# Patient Record
Sex: Male | Born: 1980 | Race: White | Hispanic: No | Marital: Married | Smoking: Never smoker
Health system: Southern US, Community
[De-identification: ages and names within clinical notes are randomized; demographics above are authoritative.]

## PROBLEM LIST (undated history)

## (undated) DIAGNOSIS — Z719 Counseling, unspecified: Secondary | ICD-10-CM

## (undated) DIAGNOSIS — E559 Vitamin D deficiency, unspecified: Secondary | ICD-10-CM

## (undated) DIAGNOSIS — F419 Anxiety disorder, unspecified: Secondary | ICD-10-CM

## (undated) DIAGNOSIS — K30 Functional dyspepsia: Secondary | ICD-10-CM

## (undated) DIAGNOSIS — Z8719 Personal history of other diseases of the digestive system: Secondary | ICD-10-CM

## (undated) DIAGNOSIS — K219 Gastro-esophageal reflux disease without esophagitis: Secondary | ICD-10-CM

## (undated) DIAGNOSIS — K760 Fatty (change of) liver, not elsewhere classified: Secondary | ICD-10-CM

## (undated) DIAGNOSIS — K5792 Diverticulitis of intestine, part unspecified, without perforation or abscess without bleeding: Secondary | ICD-10-CM

## (undated) DIAGNOSIS — E669 Obesity, unspecified: Secondary | ICD-10-CM

## (undated) DIAGNOSIS — L290 Pruritus ani: Secondary | ICD-10-CM

## (undated) HISTORY — DX: Vitamin D deficiency, unspecified: E55.9

## (undated) HISTORY — DX: Counseling, unspecified: Z71.9

## (undated) HISTORY — PX: APPENDECTOMY: SHX54

## (undated) HISTORY — DX: Pruritus ani: L29.0

## (undated) HISTORY — PX: TONSILLECTOMY: SUR1361

## (undated) HISTORY — DX: Diverticulitis of intestine, part unspecified, without perforation or abscess without bleeding: K57.92

## (undated) HISTORY — DX: Functional dyspepsia: K30

## (undated) HISTORY — DX: Gastro-esophageal reflux disease without esophagitis: K21.9

## (undated) HISTORY — DX: Fatty (change of) liver, not elsewhere classified: K76.0

## (undated) HISTORY — DX: Anxiety disorder, unspecified: F41.9

## (undated) HISTORY — DX: Obesity, unspecified: E66.9

## (undated) HISTORY — PX: OTHER SURGICAL HISTORY: SHX169

---

## 2014-06-28 ENCOUNTER — Telehealth: Payer: Self-pay | Admitting: Internal Medicine

## 2014-06-28 NOTE — Telephone Encounter (Signed)
Pts wife called and pt would like to switch care from [facility] to Dr. [name]. Pt has had several other patients and a nurse suggest Dr. [name]. Pt was recently see in hospital in [place] for diverticulitis, pt had CT scan and was treated with cipro and flagyl. Pt is better, [place] told [place] he would need a colon done in 4 weeks. Discussed with pts wife that Dr. [name] would need to review the records and decide. Records placed on Dr. Lamar SprinklesPerry's desk for review.

## 2014-06-28 NOTE — Telephone Encounter (Signed)
Pt scheduled to see Amy Esterwood PA 07/04/14@3pm . Pt aware.

## 2014-06-28 NOTE — Telephone Encounter (Signed)
This patient is transferring care from [facility]. He is requesting to see Dr. [name]. We have records here for the pt. The pt's wife Genia PlantsRobin Bar states the pt was diagnosed with diverticulitis, and told he would need a colon within 4 weeks. They want to know if it is something that can wait, or if he needs to be seen right away.

## 2014-06-28 NOTE — Telephone Encounter (Signed)
I have not seen the records, but it is okay to assume his GI care. Have him see one of the extenders (give them the records). They can decide if/when follow-up colonoscopy to be performed. I can perform that exam for him, if indicated.

## 2014-07-03 ENCOUNTER — Encounter: Payer: Self-pay | Admitting: *Deleted

## 2014-07-04 ENCOUNTER — Encounter: Payer: Self-pay | Admitting: Physician Assistant

## 2014-07-04 ENCOUNTER — Ambulatory Visit (INDEPENDENT_AMBULATORY_CARE_PROVIDER_SITE_OTHER): Payer: Managed Care, Other (non HMO) | Admitting: Physician Assistant

## 2014-07-04 VITALS — BP 128/80 | HR 80 | Ht 67.0 in | Wt 189.0 lb

## 2014-07-04 DIAGNOSIS — K5732 Diverticulitis of large intestine without perforation or abscess without bleeding: Secondary | ICD-10-CM

## 2014-07-04 DIAGNOSIS — K219 Gastro-esophageal reflux disease without esophagitis: Secondary | ICD-10-CM

## 2014-07-04 NOTE — Patient Instructions (Signed)
Call our office as needed for an appointment with Dr. Yancey FlemingsJohn Perry or one of our PA's..  We have given you a brochure on Diverticulosis & Diverticulitis.

## 2014-07-04 NOTE — Progress Notes (Signed)
Patient ID: Mario Ford, male   DOB: 1980-10-05, 34 y.o.   MRN: [id number]   Subjective:    Patient ID: Mario Ford, male    DOB: 1980-10-05, 34 y.o.   MRN: [id number]  HPI [name] is a pleasant 34 year old white male who comes in today to establish with Dr. [name]. He had been previously seen in [place] [facility]. He had a recent episode of diverticulitis and apparently was recommended to have a colonoscopy. He came here to discuss whether or not he needs colonoscopy. Patient had an episode of fairly severe lower abdominal pain on 06/16/2014 and underwent CT scan of the abdomen and pelvis with IV contrast [facility]. This was read as possible mild hepatic steatosis and uncomplicated acute sigmoid diverticulitis there was focal P colonic stranding with colonic wall thickening seen involving the left lower quadrant sigmoid colon and and large diverticulum was noted in that region no evidence for abscess. Patient was treated with a course of Cipro and Flagyl and he says his symptoms have completely resolved and he feels fine. This was his first episode of diverticulitis. He does have history of chronic GERD and is maintained on omeprazole 40 mg by mouth daily. He did have a prior EGD, which showed a small hiatal hernia. His records from [facility] will be scanned. Family history is negative for colon cancer and inflammatory bowel disease. Patient says generally does not have any problems with abdominal pain bowel movements are normal no melena or hematochezia. He has had some intermittent anal itching. He used to course of suppositories at one time which he thought helped a bit he still has intermittent pruritus.  Review of Systems Pertinent positive and negative review of systems were noted in the above HPI section.  All other review of systems was otherwise negative. Outpatient Encounter Prescriptions as of 07/04/2014  Medication Sig  . omeprazole (PRILOSEC) 40 MG  capsule Take 40 mg by mouth daily.  . [DISCONTINUED] ciprofloxacin (CIPRO) 500 MG tablet Take 500 mg by mouth 2 (two) times daily.  . [DISCONTINUED] metroNIDAZOLE (FLAGYL) 500 MG tablet Take 500 mg by mouth 2 (two) times daily.  . [DISCONTINUED] oxyCODONE-acetaminophen (PERCOCET/ROXICET) 5-325 MG per tablet Take 1 tablet by mouth every 6 (six) hours as needed for severe pain (As needed for pain,).   Allergies  Allergen Reactions  . Sulfa Antibiotics    Patient Active Problem List   Diagnosis Date Noted  . GERD (gastroesophageal reflux disease) 07/04/2014   History   Social History  . Marital Status: Married    Spouse Name: N/A  . Number of Children: N/A  . Years of Education: N/A   Occupational History  . Not on file.   Social History Main Topics  . Smoking status: Never Smoker   . Smokeless tobacco: Never Used  . Alcohol Use: 0.0 oz/week    0 Standard drinks or equivalent per week     Comment: rare  . Drug Use: No  . Sexual Activity: Not on file   Other Topics Concern  . Not on file   Social History Narrative    Mr. Marban's family history includes Cancer in his father; Diabetes Mellitus II in his maternal grandfather and paternal grandfather; Hypertension in an other family member. There is no history of Colon cancer.      Objective:    Filed Vitals:   07/04/14 1506  BP: 128/80  Pulse: 80    Physical Exam  well-developed young white male in  no acute distress, blood pressure 128/80 pulse 80 height 5 foot 7 weight 189. HEENT; nontraumatic normocephalic EOMI PERRLA sclera anicteric, Supple; no JVD, Cardiovascular; regular rate and rhythm with S1-S2 no murmur or gallop, Pulmonary; clear bilaterally, Abdomen ;soft nontender nondistended bowel sounds are active there is no palpable mass or hepatosplenomegaly, Rectal ;exam not done, Extremities; no clubbing cyanosis or edema skin warm and dry, Psych; mood and affect appropriate       Assessment & Plan:  #1 34 yo  male with recent acute initial episode of uncomplicated sigmoid diverticulitis. #2 intermittent anal pruritis #3 GERD  Plan; discussion with patient regarding diverticular disease and natural course of diverticular disease. I do not feel he has to have a colonoscopy at this point. If he has recurrent episodes of diverticulitis he will need a colonoscopy, as at his young age he may be best served by partial colectomy. Patient advised to avoid popcorn and nuts and otherwise increase fiber in diet He will call us if he has any recurrence of abdominal pain. Advised moistened wipes for pruritus and Preparation H suppositories for now if itching becomes more chronic he will return for follow-up office visit. He is asked to follow-up in one year or sooner when necessary with Dr. Marina Goodell.   Amy S Esterwood PA-C 07/04/2014   Cc: No ref. provider found

## 2014-07-10 NOTE — Progress Notes (Signed)
Agree 

## 2014-07-30 ENCOUNTER — Telehealth: Payer: Self-pay | Admitting: Physician Assistant

## 2014-07-30 NOTE — Telephone Encounter (Signed)
I would not start antibiotics based upon the symptoms so far. Diverticulitis does not usually cause diarrhea or bleeding though it can. He is going to need to be seen again, given the bleeding and problems, either with an advance practitioner or Dr. [name] to determine the next step. Please make a follow-up appointment for him. She is getting worse or failing to resolve before he comes in he should call us back. Anything more severe would require an emergency room visit.

## 2014-07-30 NOTE — Telephone Encounter (Signed)
Pt scheduled to see Willette ClusterPaula Guenther NP 08/03/14@2pm . Pt aware and knows to call back if worsens or proceed to ER if severe symptoms.

## 2014-07-30 NOTE — Telephone Encounter (Signed)
Mario Ford pt last seen 07/04/14 By Mike GipAmy Esterwood PA for diverticulitis. Pt states that this morning he started having some abdominal cramping and was a little constipated. Once he passed the hard stool he then had diarrhea that was followed by clear mucous. Now he states he is passing some blood mixed with mucous. Pt states he is still having some abdominal cramping off and on. Pt calling to see if he needs to start back on some more antibiotics. Dr. Leone Payorgessner as doc of the day please advise.

## 2014-08-03 ENCOUNTER — Encounter: Payer: Self-pay | Admitting: Nurse Practitioner

## 2014-08-03 ENCOUNTER — Ambulatory Visit (INDEPENDENT_AMBULATORY_CARE_PROVIDER_SITE_OTHER): Payer: Managed Care, Other (non HMO) | Admitting: Nurse Practitioner

## 2014-08-03 VITALS — BP 124/82 | HR 74 | Resp 16 | Ht 67.0 in | Wt 192.4 lb

## 2014-08-03 DIAGNOSIS — K648 Other hemorrhoids: Secondary | ICD-10-CM | POA: Diagnosis not present

## 2014-08-03 MED ORDER — HYDROCORTISONE ACETATE 25 MG RE SUPP: 25.0000 mg | Freq: Every day | RECTAL | Status: DC

## 2014-08-03 NOTE — Patient Instructions (Signed)
We have sent the following medications to your pharmacy for you to pick up at your convenience: Anusol supp at bedtime for 10 days Please call our office if bleeding starts again

## 2014-08-03 NOTE — Progress Notes (Signed)
     History of Present Illness:   Patient is a 34 year old male seen here less than one month ago to discuss need for colonoscopy after an episode of diverticulitis in [place] . He improved with antibiotics. We did not recommend colonoscopy unless patient had recurrent bouts of diverticulitis.   Early Monday morning patient woke up with abdominal cramping. He went to the bathroom and passed a normal stool followed by loose stool with mucus and blood. His next bowel movement a couple of days later was formed but still had a small amount of blood on it. All of his symptoms have since resolved, stools back to normal.   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 Blood is always bright red, minor amount, and located on the tissue alone.    Current Medications, Allergies, Past Medical History, Past Surgical History, Family History and Social History were reviewed in [facility] electronic medical record.  Physical Exam: General: Pleasant, well developed , white male in no acute distress Head: Normocephalic and atraumatic Eyes:  sclerae anicteric, conjunctiva pink  Ears: Normal auditory acuity Lungs: Clear throughout to auscultation Heart: Regular rate and rhythm Abdomen: Soft, non distended, non-tender. No masses, no hepatomegaly. Normal bowel sounds Rectal: Mildly inflamed external hemorrhoids. On anoscopy patient had inflamed internal hemorrhoids Musculoskeletal: Symmetrical with no gross deformities  Extremities: No edema  Neurological: Alert oriented x 4, grossly nonfocal Psychological:  Alert and cooperative. Normal mood and affect  Assessment and Recommendations:  481. 34 year old male with recent crampy diarrheal illness lasting less than 24 hours. Patient had some associated rectal bleeding. All of his symptoms have resolved. Suspect this was viral in nature and bleeding related to hemorrhoids. There  are some bacterial infections which can cause bloody diarrhea but this seems unlikely even short duration of symptoms.   2. Hemorrhoids. Patient has internal and external hemorrhoids. He describes several years of occasional scant bleeding with bowel movements (drop of blood on tissue). Only once has he seen blood on the stool itself or in toilet bowel and that was Monday with the diarrheal illness. No weight loss, no family history colon cancer. Colonoscopy discussed but we both agree that the scant bleeding seems most likely hemorrhoidal in nature. Will treat with steroid suppositories. He will contact us for increased bleeding (amount, frequency) or bowel changes at which time colonoscopy will be warranted.

## 2014-08-06 NOTE — Progress Notes (Signed)
Gunnar FusiPaula, please follow-up with him yourself. Any further issues, whatsoever, I would proceed with colonoscopy. Thank you

## 2014-10-18 ENCOUNTER — Telehealth: Payer: Self-pay | Admitting: Internal Medicine

## 2014-10-18 MED ORDER — CIPROFLOXACIN HCL 500 MG PO TABS: 500.0000 mg | ORAL_TABLET | Freq: Two times a day (BID) | ORAL | Status: DC

## 2014-10-18 NOTE — Telephone Encounter (Signed)
Spoke with pts wife and she is aware. Script sent to pharmacy. 

## 2014-10-18 NOTE — Telephone Encounter (Signed)
Pts wife states her husband is having terrible LLQ abd pain and has a temp of 100.5. Pt feels he is having a diverticulitis flare, he is out of town on vacation but is coming home this pm due to feeling bad. Request appt tomorrow, pt scheduled to see Willette ClusterPaula Guenther NP tomorrow at 3pm. Would like to start on antibiotics today and also requesting something for pain. Dr. [name] please advise.

## 2014-10-18 NOTE — Telephone Encounter (Signed)
Cipro 500 mg twice a day 10 days, Tylenol for pain, full liquid diet, and see Gunnar FusiPaula tomorrow. Thank you

## 2014-10-19 ENCOUNTER — Ambulatory Visit (INDEPENDENT_AMBULATORY_CARE_PROVIDER_SITE_OTHER): Payer: Managed Care, Other (non HMO) | Admitting: Nurse Practitioner

## 2014-10-19 ENCOUNTER — Other Ambulatory Visit (INDEPENDENT_AMBULATORY_CARE_PROVIDER_SITE_OTHER): Payer: Managed Care, Other (non HMO)

## 2014-10-19 ENCOUNTER — Encounter: Payer: Self-pay | Admitting: Nurse Practitioner

## 2014-10-19 VITALS — BP 138/78 | Ht 67.0 in | Wt 195.1 lb

## 2014-10-19 DIAGNOSIS — K5732 Diverticulitis of large intestine without perforation or abscess without bleeding: Secondary | ICD-10-CM

## 2014-10-19 LAB — CBC WITH DIFFERENTIAL/PLATELET
Basophils Absolute: 0 10*3/uL (ref 0.0–0.1)
Basophils Relative: 0.3 % (ref 0.0–3.0)
Eosinophils Absolute: 0 10*3/uL (ref 0.0–0.7)
Eosinophils Relative: 0.4 % (ref 0.0–5.0)
HEMATOCRIT: 44.9 % (ref 39.0–52.0)
Hemoglobin: 15 g/dL (ref 13.0–17.0)
Lymphocytes Relative: 14.3 % (ref 12.0–46.0)
Lymphs Abs: 1.6 10*3/uL (ref 0.7–4.0)
MCHC: 33.4 g/dL (ref 30.0–36.0)
MCV: 87.3 fl (ref 78.0–100.0)
MONOS PCT: 6 % (ref 3.0–12.0)
Monocytes Absolute: 0.7 10*3/uL (ref 0.1–1.0)
NEUTROS ABS: 9 10*3/uL — AB (ref 1.4–7.7)
Neutrophils Relative %: 79 % — ABNORMAL HIGH (ref 43.0–77.0)
PLATELETS: 292 10*3/uL (ref 150.0–400.0)
RBC: 5.14 Mil/uL (ref 4.22–5.81)
RDW: 12.5 % (ref 11.5–15.5)
WBC: 11.4 10*3/uL — ABNORMAL HIGH (ref 4.0–10.5)

## 2014-10-19 MED ORDER — METRONIDAZOLE 500 MG PO TABS
500.0000 mg | ORAL_TABLET | Freq: Three times a day (TID) | ORAL | Status: DC
Start: 2014-10-19 — End: 2014-12-12

## 2014-10-19 MED ORDER — HYDROCODONE-ACETAMINOPHEN 5-325 MG PO TABS: 1.0000 | ORAL_TABLET | Freq: Four times a day (QID) | ORAL | Status: DC | PRN

## 2014-10-19 NOTE — Patient Instructions (Addendum)
You have been scheduled for a colonoscopy. Please follow written instructions given to you at your visit today.  Please pick up your prep supplies at the pharmacy within the next 1-3 days. If you use inhalers (even only as needed), please bring them with you on the day of your procedure. Your physician has requested that you go to www.startemmi.com and enter the access code given to you at your visit today. This web site gives a general overview about your procedure. However, you should still follow specific instructions given to you by our office regarding your preparation for the procedure.  Your physician has requested that you go to the basement for lab work before leaving today.  We have sent medications to your pharmacy for you to pick up at your convenience.  Continue Cipro.  Start a clear liquid diet until symptoms are better. If your symptoms worsen go to the ED.       Normal BMI (Body Mass Index- based on height and weight) is between 19 and 25. Your BMI today is Body mass index is 30.55 kg/(m^2). Marland Kitchen. Please consider follow up  regarding your BMI with your Primary Care Provider.

## 2014-10-19 NOTE — Progress Notes (Signed)
     History of Present Illness:   Patient is a 34 year old male who we evaluated in March of this year after an episode of diverticulitis in [place]. We did not recommend colonoscopy unless the patient had recurrent bouts of diverticulitis. I saw the patient late April of this year after an episode of crampy diarrhea associated with rectal bleeding. By the time I saw him, his symptoms had all resolved. Hemorrhoids found on exam. After a lengthy discussion about colonoscopy we both agreed that the bleeding was scant and probably hemorrhoidal in nature. We prescribed steroid suppositories and advised patient to call back if he had recurrent bleeding or any bowel changes Patient here with wife for evaluation of recurrent abdominal pain reminiscent of when he had diverticulitis.  He had similar pain while visiting [place] a few weeks ago. ED in [place] gave him a course of antibiotics and symptoms resolved. Now back with recurrent LLQ pain and fever. Pain unrelated to meals of defecation. No urinary symptoms.   Current Medications, Allergies, Past Medical History, Past Surgical History, Family History and Social History were reviewed in [facility] electronic medical record.   Physical Exam: General: Pleasant, well developed , white male in no acute distress Head: Normocephalic and atraumatic Eyes:  sclerae anicteric, conjunctiva pink  Ears: Normal auditory acuity Lungs: Clear throughout to auscultation Heart: Regular rate and rhythm Abdomen: Soft, non distended, mild-moderate LLQ tenderness.  No masses, no hepatomegaly. Normal bowel sounds Musculoskeletal: Symmetrical with no gross deformities  Extremities: No edema  Neurological: Alert oriented x 4, grossly nonfocal Psychological:  Alert and cooperative. Normal mood and affect  Assessment and Recommendations:  34 year old male with history of sigmoid diverticulitis in March. He had similar pain two weeks ago in [place]. ED there gave him  antibiotics, pain resolved but now with recurrent LLQ pain and fever after completion of antibiotics. Patient needs a colonoscopy to exclude neoplasm. Following that he will likely need surgical evaluation for recurrent diverticulitis, In the meantime he should complete Cipro which we called in for him a few days ago. Will add 10 days of flagyl. Hydrocodone for pain. Stay on clears until feeling better. He will go to ED over weekend if symptoms worsen.

## 2014-10-21 DIAGNOSIS — K5732 Diverticulitis of large intestine without perforation or abscess without bleeding: Secondary | ICD-10-CM | POA: Insufficient documentation

## 2014-10-21 NOTE — Progress Notes (Signed)
Agree with initial assessment and plans 

## 2014-11-28 ENCOUNTER — Encounter: Payer: Self-pay | Admitting: Internal Medicine

## 2014-12-10 ENCOUNTER — Telehealth: Payer: Self-pay | Admitting: Internal Medicine

## 2014-12-10 MED ORDER — NA SULFATE-K SULFATE-MG SULF 17.5-3.13-1.6 GM/177ML PO SOLN: 1.0000 | Freq: Once | ORAL | Status: DC

## 2014-12-10 NOTE — Telephone Encounter (Signed)
Sent rx for suprep to [facility] per patient's request

## 2014-12-12 ENCOUNTER — Ambulatory Visit (AMBULATORY_SURGERY_CENTER): Payer: Managed Care, Other (non HMO) | Admitting: Internal Medicine

## 2014-12-12 ENCOUNTER — Encounter: Payer: Self-pay | Admitting: Internal Medicine

## 2014-12-12 VITALS — BP 137/85 | HR 90 | Temp 99.8°F | Resp 14 | Ht 67.0 in | Wt 195.0 lb

## 2014-12-12 DIAGNOSIS — R935 Abnormal findings on diagnostic imaging of other abdominal regions, including retroperitoneum: Secondary | ICD-10-CM

## 2014-12-12 DIAGNOSIS — K5732 Diverticulitis of large intestine without perforation or abscess without bleeding: Secondary | ICD-10-CM | POA: Diagnosis present

## 2014-12-12 DIAGNOSIS — K5730 Diverticulosis of large intestine without perforation or abscess without bleeding: Secondary | ICD-10-CM | POA: Diagnosis not present

## 2014-12-12 MED ORDER — SODIUM CHLORIDE 0.9 % IV SOLN: 500.0000 mL | INTRAVENOUS | Status: DC

## 2014-12-12 NOTE — Progress Notes (Signed)
Transferred to recovery room. A/O x3, pleased with MAC.  VSS.  Report to Shelia, RN. 

## 2014-12-12 NOTE — Patient Instructions (Addendum)
YOU HAD AN ENDOSCOPIC PROCEDURE TODAY AT THE [facility]:   Refer to the procedure report that was given to you for any specific questions about what was found during the examination.  If the procedure report does not answer your questions, please call your gastroenterologist to clarify.  If you requested that your care partner not be given the details of your procedure findings, then the procedure report has been included in a sealed envelope for you to review at your convenience later.  YOU SHOULD EXPECT: Some feelings of bloating in the abdomen. Passage of more gas than usual.  Walking can help get rid of the air that was put into your GI tract during the procedure and reduce the bloating. If you had a lower endoscopy (such as a colonoscopy or flexible sigmoidoscopy) you may notice spotting of blood in your stool or on the toilet paper. If you underwent a bowel prep for your procedure, you may not have a normal bowel movement for a few days.  Please Note:  You might notice some irritation and congestion in your nose or some drainage.  This is from the oxygen used during your procedure.  There is no need for concern and it should clear up in a day or so.  SYMPTOMS TO REPORT IMMEDIATELY:   Following lower endoscopy (colonoscopy or flexible sigmoidoscopy):  Excessive amounts of blood in the stool  Significant tenderness or worsening of abdominal pains  Swelling of the abdomen that is new, acute  Fever of 100F or higher    For urgent or emergent issues, a gastroenterologist can be reached at any hour by calling [phone].   DIET: Your first meal following the procedure should be a small meal and then it is ok to progress to your normal diet. Heavy or fried foods are harder to digest and may make you feel nauseous or bloated.  Likewise, meals heavy in dairy and vegetables can increase bloating.  Drink plenty of fluids but you should avoid alcoholic beverages for 24  hours.  ACTIVITY:  You should plan to take it easy for the rest of today and you should NOT DRIVE or use heavy machinery until tomorrow (because of the sedation medicines used during the test).    FOLLOW UP: Our staff will call the number listed on your records the next business day following your procedure to check on you and address any questions or concerns that you may have regarding the information given to you following your procedure. If we do not reach you, we will leave a message.  However, if you are feeling well and you are not experiencing any problems, there is no need to return our call.  We will assume that you have returned to your regular daily activities without incident.  If any biopsies were taken you will be contacted by phone or by letter within the next 1-3 weeks.  Please call us at [phone] if you have not heard about the biopsies in 3 weeks.    SIGNATURES/CONFIDENTIALITY: You and/or your care partner have signed paperwork which will be entered into your electronic medical record.  These signatures attest to the fact that that the information above on your After Visit Summary has been reviewed and is understood.  Full responsibility of the confidentiality of this discharge information lies with you and/or your care-partner.   Resume medications. Information given on diverticulosis and high fiber diet. 

## 2014-12-12 NOTE — Op Note (Signed)
[facility] [address] [place], [address]   COLONOSCOPY PROCEDURE REPORT  PATIENT: Mario, Ford  MR#: [id number] BIRTHDATE: 09-27-1980 , 34  yrs. old GENDER: male ENDOSCOPIST: Roxy Cedar, MD REFERRED BY:.  Self / Office PROCEDURE DATE:  12/12/2014 PROCEDURE:   Colonoscopy, diagnostic First Screening Colonoscopy - Avg.  risk and is 50 yrs.  old or older - No.  Prior Negative Screening - Now for repeat screening. N/A  History of Adenoma - Now for follow-up colonoscopy & has been > or = to 3 yrs.  N/A  Polyps removed today? No Recommend repeat exam, <10 yrs? No ASA CLASS:   Class I INDICATIONS:abdominal pain and an abnormal CT.  Question recurrent diverticulitis MEDICATIONS: Monitored anesthesia care and Propofol 300 mg IV  DESCRIPTION OF PROCEDURE:   After the risks benefits and alternatives of the procedure were thoroughly explained, informed consent was obtained.  The digital rectal exam revealed no abnormalities of the rectum.   The LB [id number]  endoscope was introduced through the anus and advanced to the cecum, which was identified by both the appendix and ileocecal valve. No adverse events experienced.   The quality of the prep was excellent. (Suprep was used)  The instrument was then slowly withdrawn as the colon was fully examined. Estimated blood loss is zero unless otherwise noted in this procedure report.  COLON FINDINGS: There was severe diverticulosis noted throughout the entire examined colon. The changes were most prominent in the left colon. There is no active diverticulitis or other mucosal abnormality.   The examination was otherwise normal.  Retroflexed views revealed internal hemorrhoids. The time to cecum = 2.5 Withdrawal time = 5.9   The scope was withdrawn and the procedure completed. COMPLICATIONS: There were no immediate complications.  ENDOSCOPIC IMPRESSION: 1.   Severe diverticulosis was noted throughout the entire  examined colon 2.   The examination was otherwise normal 3. Recurrent diverticulitis. Resolved  RECOMMENDATIONS: 1.  High fiber diet 2.  Continue current colorectal screening recommendations for "routine risk" patients with a repeat colonoscopy age 22. 3. Return to the care of your primary provider. If you have any further difficulties with your abdomen, feel free to contact your primary provider or our office.Marland Kitchen  eSigned:  Roxy Cedar, MD 12/12/2014 3:40 PM   cc: The Patient    ; Renaldo Harrison, M.D.

## 2014-12-13 ENCOUNTER — Telehealth: Payer: Self-pay | Admitting: Internal Medicine

## 2014-12-13 ENCOUNTER — Telehealth: Payer: Self-pay | Admitting: *Deleted

## 2014-12-13 NOTE — Telephone Encounter (Signed)
Pts wife states that the pt was previously taking omeprazole  daily for reflux, his prior GI doc prescribed this. Pt saw his PCP and he was no willing to refill this med as he was concerned about the side effects and possible long term effects of omeprazole. Pt is taking zantac otc but states this is not helping, having a lot of heartburn and discomfort. Wife wants to know if Dr. Marina Goodell thinks pt should resume omeprazole . Please advise.

## 2014-12-13 NOTE — Telephone Encounter (Signed)
Yes, if it works for him. We can prescribe, but he will need an OV in one year if we are are going to manage his GERD

## 2014-12-13 NOTE — Telephone Encounter (Signed)
Left message that we called for f/u 

## 2014-12-14 MED ORDER — OMEPRAZOLE 40 MG PO CPDR: 40.0000 mg | DELAYED_RELEASE_CAPSULE | Freq: Every day | ORAL | Status: DC

## 2014-12-14 NOTE — Telephone Encounter (Signed)
Pts wife aware and script sent to pharmacy. 

## 2015-11-18 ENCOUNTER — Ambulatory Visit (INDEPENDENT_AMBULATORY_CARE_PROVIDER_SITE_OTHER): Payer: Managed Care, Other (non HMO) | Admitting: Physician Assistant

## 2015-11-18 ENCOUNTER — Telehealth: Payer: Self-pay | Admitting: Internal Medicine

## 2015-11-18 ENCOUNTER — Encounter: Payer: Self-pay | Admitting: Physician Assistant

## 2015-11-18 VITALS — BP 124/80 | HR 96 | Ht 65.5 in | Wt 194.2 lb

## 2015-11-18 DIAGNOSIS — K5792 Diverticulitis of intestine, part unspecified, without perforation or abscess without bleeding: Secondary | ICD-10-CM | POA: Diagnosis not present

## 2015-11-18 MED ORDER — CIPROFLOXACIN HCL 500 MG PO TABS: 500.0000 mg | ORAL_TABLET | Freq: Two times a day (BID) | ORAL | 1 refills | Status: AC

## 2015-11-18 NOTE — Patient Instructions (Signed)
We sent a prescription for Cipro 500 mg to [facility] [place] [place].   Please call if symptoms worsen or you are not feeling significantly better.    We made you a follow up appointment with Dr. Yancey FlemingsJohn Perry 12-19-2015 at 2:15 PM.

## 2015-11-18 NOTE — Progress Notes (Signed)
Subjective:    Patient ID: Mario Ford, male    DOB: 07/17/1980, 35 y.o.   MRN: [id number]  HPI Mario Ford is a pleasant 35 year old white male known to Dr. Yancey Flemings with history of GERD and recurrent diverticulitis. Patient comes in today with a 3 day history of chills, fever loose stools and left lower quadrant pain which is very consistent with his episodes of diverticulitis. Patient says he has been having recurrent symptoms at least every 2 months with episodes of what he describes as a deep tenderness in the left side associated with bloating and pressure in his lower abdomen. He says usually he will go on a liquid diet for couple of days and these mild episodes clear up without any other medications. The last episode was about 3 weeks ago and lasted 2 days.  His last episode of acute diverticulitis requiring antibiotics in August 2016.   He has had several episodes of diverticulitis over the past couple of years. Last colonoscopy done in August 2016 showed severe diverticulosis of the entire colon -more prominent in the left colon. He is frustrated and concerned with his recurrent episodes. Does not have any problems with constipation, is generally very regular and eats a high-fiber diet.  Review of Systems Pertinent positive and negative review of systems were noted in the above HPI section.  All other review of systems was otherwise negative.  Outpatient Encounter Prescriptions as of 11/18/2015  Medication Sig  . Multiple Vitamin (MULTIVITAMIN) tablet Take 1 tablet by mouth daily.  Marland Kitchen omeprazole (PRILOSEC) 40 MG capsule Take 1 capsule (40 mg total) by mouth daily.  . ciprofloxacin (CIPRO) 500 MG tablet Take 1 tablet (500 mg total) by mouth 2 (two) times daily.  . [DISCONTINUED] HYDROcodone-acetaminophen (NORCO/VICODIN) 5-325 MG per tablet Take 1 tablet by mouth every 6 (six) hours as needed for moderate pain. (Patient not taking: Reported on 12/12/2014)  . [DISCONTINUED] omeprazole (PRILOSEC)  40 MG capsule Take 40 mg by mouth daily.   No facility-administered encounter medications on file as of 11/18/2015.    Allergies  Allergen Reactions  . Sulfa Antibiotics    Patient Active Problem List   Diagnosis Date Noted  . Diverticulitis of colon 10/21/2014  . Internal hemorrhoids 08/03/2014  . GERD (gastroesophageal reflux disease) 07/04/2014   Social History   Social History  . Marital status: Married    Spouse name: N/A  . Number of children: N/A  . Years of education: N/A   Occupational History  . Accountant     Social History Main Topics  . Smoking status: Never Smoker  . Smokeless tobacco: Never Used  . Alcohol use 0.0 oz/week     Comment: rare  . Drug use: No  . Sexual activity: Not on file   Other Topics Concern  . Not on file   Social History Narrative  . No narrative on file    Mario Ford's family history includes Diabetes Mellitus II in his maternal grandfather and paternal grandfather; Melanoma in his father.      Objective:    Vitals:   11/18/15 1430  BP: 124/80  Pulse: 96    Physical Exam  well-developed older white male in no acute distress, pleasant blood pressure 124/80, pulse 96 weight 194. HEENT; nontraumatic normocephalic EOMI PERRLA sclera anicteric, Cardiovascular; regular rate and rhythm with S1-S2 no murmur or gallop,  Pulmonary; clear bilaterally,, Abdomen ;soft, bowel sounds are present he is quite tender in the left lower quadrant is no palpable  mass no guarding or rebound , Rectal ;exam not done, Extremities; no clubbing cyanosis or edema skin warm and dry, Neuropsych; mood and affect appropriate       Assessment & Plan:   #751  35 year old white male with history of recurrent diverticulitis presenting now with acute left lower quadrant pain fever or chills and loose stools 3 days. Symptoms very consistent with his prior episodes of diverticulitis #2 GERD  Plan; start Cipro 500 mg by mouth twice a day 14 days. Patient says he  cannot take metronidazole at this point secondary to significant nausea. Full liquid to very soft diet over the next 4-5 days and push fluids-he can gradually advance his diet as his pain resolves Patient is asked to call should he develop any worsening of symptoms or if he fails to resolve after 2 weeks of Cipro. I suggested he try Florastor or Culturelle on a daily basis after completing antibiotics to see if this may be beneficial for his recurrent mild episodes ofbloatingand pressure. We began discussion about consideration of elective surgery today and he would like to discuss further with Dr. [name]. We will schedule follow-up office visit in 3-4 weeks.   Amy S Esterwood PA-C 11/18/2015   Cc: Renaldo HarrisonAddis, Daniel, DO

## 2015-11-18 NOTE — Telephone Encounter (Signed)
Pts wife states pt is having a diverticulitis flare, states it is the second one in 30 days. States pt is having a lot of pain and requests he be seen today. Pt scheduled to see Mike GipAmy Esterwood PA today at 2:30pm. Pt aware of appt.

## 2015-11-25 NOTE — Progress Notes (Signed)
Agree with initial assessment and plans as outlined 

## 2016-01-08 ENCOUNTER — Ambulatory Visit (INDEPENDENT_AMBULATORY_CARE_PROVIDER_SITE_OTHER): Payer: Managed Care, Other (non HMO) | Admitting: Internal Medicine

## 2016-01-08 ENCOUNTER — Encounter: Payer: Self-pay | Admitting: Internal Medicine

## 2016-01-08 ENCOUNTER — Encounter (INDEPENDENT_AMBULATORY_CARE_PROVIDER_SITE_OTHER): Payer: Self-pay

## 2016-01-08 VITALS — BP 144/80 | HR 112 | Ht 65.5 in | Wt 199.5 lb

## 2016-01-08 DIAGNOSIS — K5732 Diverticulitis of large intestine without perforation or abscess without bleeding: Secondary | ICD-10-CM

## 2016-01-08 MED ORDER — AMOXICILLIN-POT CLAVULANATE 875-125 MG PO TABS: 1.0000 | ORAL_TABLET | Freq: Two times a day (BID) | ORAL | 0 refills | Status: DC

## 2016-01-08 NOTE — Patient Instructions (Signed)
We have sent the following medications to your pharmacy for you to pick up at your convenience: Augmentin  Take Metamucil 1-2 tablespoons of Metamucil daily  [facility] with an appointment with Dr. Johna SheriffHoxworth

## 2016-01-08 NOTE — Progress Notes (Signed)
HISTORY OF PRESENT ILLNESS:  Mario Ford is a 35 y.o. male with a history of recurrent diverticulitis who presents today regarding the same. He is accompanied by his wife. Patient has had a greater than five-year history of recurrent episodes with acute uncomplicated sigmoid diverticulitis. Substantiated by CT scan July 2013 and March 2016 [facility]([place]). Patient was seen initially in this office approximately 1 year ago for the same problem. On 12/12/2014 he underwent complete colonoscopy. The preparation was excellent. The examination revealed severe diverticulosis throughout the entire colon. More recently seen for acute left lower quadrant pain with fevers. Intolerant to metronidazole and was treated with Augmentin. Symptoms have resolved. Feels fine now. Looking for alternative therapies. Heart and frustrated of multiple episodes. Fearful of more complicated episodes in the future. Particularly, interested in the role of surgical therapy. They have many excellent questions.  REVIEW OF SYSTEMS:  All non-GI ROS negative except for muscle cramps  Past Medical History:  Diagnosis Date  . Anal itch   . Diverticulitis   . Esophageal reflux   . Fatty liver   . Functional dyspepsia   . Health education   . Obesity   . Vitamin D deficiency     Past Surgical History:  Procedure Laterality Date  . acl-left knee    . APPENDECTOMY    . TONSILLECTOMY      Social History Mario Ford  reports that he has never smoked. He has never used smokeless tobacco. He reports that he drinks alcohol. He reports that he does not use drugs.  family history includes Diabetes Mellitus II in his maternal grandfather and paternal grandfather; Melanoma in his father.  Allergies  Allergen Reactions  . Sulfa Antibiotics        PHYSICAL EXAMINATION: Vital signs: BP (!) 144/80 (BP Location: Left Arm, Patient Position: Sitting, Cuff Size: Normal)   Pulse (!) 112   Ht 5' 5.5" (1.664 m)   Wt 199 lb 8 oz (90.5 kg)    BMI 32.69 kg/m  General: Well-developed, well-nourished, no acute distress. Obese Abdomen: Not reexamined Psychiatric: alert and oriented x3. Cooperative   ASSESSMENT:  #1. Recurrent acute diverticulitis. Young age. Multiple episodes. I believe that this patient would benefit from surgical therapy. His attacks or always the same (left lower quadrant with fever) and CT imaging has demonstrated sigmoid disease. Thus, I suspect he would do well with left hemicolectomy. We will solicited a surgical opinion. He and his wife are interested   PLAN:  #1. Long discussion on diverticular disease, diverticular spasm, and diverticulitis. We discussed uncomplicated and complicated features of the disease #2. Fiber supplementation with Metamucil #3. Prescription of Augmentin 875 mg twice daily for 10 days to have on hand should he have an attack and not have immediate access to medical assistance. If he does start antibiotic I have asked to contact the office to report this for documentation purposes and to possibly be seen. He agrees. #4. Surgical opinion with Dr. Glenna FellowsBenjamin Hoxworth

## 2016-01-10 ENCOUNTER — Telehealth: Payer: Self-pay

## 2016-01-10 NOTE — Telephone Encounter (Signed)
Faxed information requested by [facility] so they can schedule appointment with Dr. Hoxworth for patient 

## 2016-01-13 ENCOUNTER — Telehealth: Payer: Self-pay | Admitting: Internal Medicine

## 2016-01-13 NOTE — Telephone Encounter (Signed)
Dr. Maisie Fushomas or Dr. Ezzard StandingNewman

## 2016-01-13 NOTE — Telephone Encounter (Signed)
Spoke with pts wife and she is aware. 

## 2016-01-13 NOTE — Telephone Encounter (Signed)
Pt scheduled to see Dr. Johna SheriffHoxworth in November. Pt would like to be seen sooner, has young children and does not want to have surgery around Christmas time. Have to have surgery done before the end of the year though due to insurance.   Pt wants to know what other surgeons he would recommend at [facility] so they can see about getting a sooner appt. Please advise.

## 2016-01-22 ENCOUNTER — Ambulatory Visit: Payer: Self-pay | Admitting: General Surgery

## 2016-02-03 ENCOUNTER — Encounter (HOSPITAL_COMMUNITY): Payer: Self-pay

## 2016-02-05 NOTE — Patient Instructions (Addendum)
Mario Ford  02/05/2016   Your procedure is scheduled on: 02/10/16  [facility]  [facility] to 3rd floor to  Short Stay Center at 1045 AM.  Call this number if you have problems the morning of surgery [phone]   Remember: ONLY 1 PERSON MAY GO WITH YOU TO SHORT STAY TO GET  READY MORNING OF YOUR SURGERY.  FOLLOW BOWEL PREP INSTRUCTIONS AS PER SURGEONS OFFICE.Marland Kitchen.INCREASE FLUID INTAKE WITH BOWEL PREP TO PREVENT DEHYDRATION MAY DRINK CLEAR LIQUIDS MORNING OF SURGERY UNTIL 0645 AM--THEN NOTHING BY MOUTH  Take these medicines the morning of surgery with A SIP OF WATER: Omeprazole DO NOT TAKE ANY DIABETIC MEDICATIONS DAY OF YOUR SURGERY                               You may not have any metal on your body including hair pins and              piercings  Do not wear jewelry, make-up, lotions, powders or perfumes, deodorant             Do not wear nail polish.  Do not shave  48 hours prior to surgery.              Men may shave face and neck.   Do not bring valuables to the hospital. [facility] IS NOT             RESPONSIBLE   FOR VALUABLES.  Contacts, dentures or bridgework may not be worn into surgery.  Leave suitcase in the car. After surgery it may be brought to your room.                Please read over the following fact sheets you were given: _____________________________________________________________________             [facility] - Preparing for Surgery Before surgery, you can play an important role.  Because skin is not sterile, your skin needs to be as free of germs as possible.  You can reduce the number of germs on your skin by washing with CHG (chlorahexidine gluconate) soap before surgery.  CHG is an antiseptic cleaner which kills germs and bonds with the skin to continue killing germs even after washing. Please DO NOT use if you have an allergy to CHG or antibacterial soaps.  If your skin becomes reddened/irritated  stop using the CHG and inform your nurse when you arrive at Short Stay. Do not shave (including legs and underarms) for at least 48 hours prior to the first CHG shower.  You may shave your face/neck. Please follow these instructions carefully:  1.  Shower with CHG Soap the night before surgery and the  morning of Surgery.  2.  If you choose to wash your hair, wash your hair first as usual with your  normal  shampoo.  3.  After you shampoo, rinse your hair and body thoroughly to remove the  shampoo.                           4.  Use CHG as you would any other liquid soap.  You can apply chg directly  to the skin and wash  Gently with a scrungie or clean washcloth.  5.  Apply the CHG Soap to your body ONLY FROM THE NECK DOWN.   Do not use on face/ open                           Wound or open sores. Avoid contact with eyes, ears mouth and genitals (private parts).                       Wash face,  Genitals (private parts) with your normal soap.             6.  Wash thoroughly, paying special attention to the area where your surgery  will be performed.  7.  Thoroughly rinse your body with warm water from the neck down.  8.  DO NOT shower/wash with your normal soap after using and rinsing off  the CHG Soap.                9.  Pat yourself dry with a clean towel.            10.  Wear clean pajamas.            11.  Place clean sheets on your bed the night of your first shower and do not  sleep with pets. Day of Surgery : Do not apply any lotions/deodorants the morning of surgery.  Please wear clean clothes to the hospital/surgery center.  FAILURE TO FOLLOW THESE INSTRUCTIONS MAY RESULT IN THE CANCELLATION OF YOUR SURGERY PATIENT SIGNATURE_________________________________  NURSE SIGNATURE__________________________________  ________________________________________________________________________    CLEAR LIQUID DIET   Foods Allowed                                                                      Foods Excluded  Coffee and tea, regular and decaf                             liquids that you cannot  Plain Jell-O in any flavor                                             see through such as: Fruit ices (not with fruit pulp)                                     milk, soups, orange juice  Iced Popsicles                                    All solid food Carbonated beverages, regular and diet                                    Cranberry, grape and apple juices Sports drinks like Gatorade Lightly seasoned clear broth or consume(fat free) Sugar, honey syrup  Sample Menu Breakfast                                Lunch                                     Supper Cranberry juice                    Beef broth                            Chicken broth Jell-O                                     Grape juice                           Apple juice Coffee or tea                        Jell-O                                      Popsicle                                                Coffee or tea                        Coffee or tea  _____________________________________________________________________

## 2016-02-06 ENCOUNTER — Encounter (HOSPITAL_COMMUNITY): Payer: Self-pay

## 2016-02-06 ENCOUNTER — Encounter (HOSPITAL_COMMUNITY)
Admission: RE | Admit: 2016-02-06 | Discharge: 2016-02-06 | Disposition: A | Discharge status: Home / Self Care | Payer: Managed Care, Other (non HMO) | Source: Ambulatory Visit | Attending: General Surgery | Admitting: General Surgery

## 2016-02-06 DIAGNOSIS — Z01812 Encounter for preprocedural laboratory examination: Secondary | ICD-10-CM | POA: Diagnosis present

## 2016-02-06 DIAGNOSIS — K5792 Diverticulitis of intestine, part unspecified, without perforation or abscess without bleeding: Secondary | ICD-10-CM | POA: Insufficient documentation

## 2016-02-06 HISTORY — DX: Personal history of other diseases of the digestive system: Z87.19

## 2016-02-06 LAB — CBC
HCT: 45.4 % (ref 39.0–52.0)
Hemoglobin: 15.1 g/dL (ref 13.0–17.0)
MCH: 28.8 pg (ref 26.0–34.0)
MCHC: 33.3 g/dL (ref 30.0–36.0)
MCV: 86.6 fL (ref 78.0–100.0)
PLATELETS: 291 10*3/uL (ref 150–400)
RBC: 5.24 MIL/uL (ref 4.22–5.81)
RDW: 13 % (ref 11.5–15.5)
WBC: 12.8 10*3/uL — ABNORMAL HIGH (ref 4.0–10.5)

## 2016-02-06 LAB — ABO/RH: ABO/RH(D): O NEG

## 2016-02-10 ENCOUNTER — Inpatient Hospital Stay (HOSPITAL_COMMUNITY)
Admission: RE | Admit: 2016-02-10 | Discharge: 2016-02-19 | DRG: 330 | Disposition: A | Discharge status: Home / Self Care | Payer: Managed Care, Other (non HMO) | Source: Ambulatory Visit | Attending: General Surgery | Admitting: General Surgery

## 2016-02-10 ENCOUNTER — Inpatient Hospital Stay (HOSPITAL_COMMUNITY): Payer: Managed Care, Other (non HMO) | Admitting: Anesthesiology

## 2016-02-10 ENCOUNTER — Encounter (HOSPITAL_COMMUNITY): Payer: Self-pay

## 2016-02-10 ENCOUNTER — Encounter (HOSPITAL_COMMUNITY)
Admission: RE | Disposition: A | Discharge status: Home / Self Care | Payer: Self-pay | Source: Ambulatory Visit | Attending: General Surgery

## 2016-02-10 DIAGNOSIS — K219 Gastro-esophageal reflux disease without esophagitis: Secondary | ICD-10-CM | POA: Diagnosis present

## 2016-02-10 DIAGNOSIS — I10 Essential (primary) hypertension: Secondary | ICD-10-CM | POA: Diagnosis present

## 2016-02-10 DIAGNOSIS — K5732 Diverticulitis of large intestine without perforation or abscess without bleeding: Secondary | ICD-10-CM | POA: Diagnosis present

## 2016-02-10 DIAGNOSIS — J9382 Other air leak: Secondary | ICD-10-CM | POA: Diagnosis present

## 2016-02-10 DIAGNOSIS — K660 Peritoneal adhesions (postprocedural) (postinfection): Secondary | ICD-10-CM | POA: Diagnosis present

## 2016-02-10 DIAGNOSIS — Z823 Family history of stroke: Secondary | ICD-10-CM

## 2016-02-10 DIAGNOSIS — N400 Enlarged prostate without lower urinary tract symptoms: Secondary | ICD-10-CM | POA: Diagnosis present

## 2016-02-10 DIAGNOSIS — B359 Dermatophytosis, unspecified: Secondary | ICD-10-CM | POA: Diagnosis present

## 2016-02-10 DIAGNOSIS — Z882 Allergy status to sulfonamides status: Secondary | ICD-10-CM

## 2016-02-10 DIAGNOSIS — R000 Tachycardia, unspecified: Secondary | ICD-10-CM | POA: Diagnosis not present

## 2016-02-10 DIAGNOSIS — R52 Pain, unspecified: Secondary | ICD-10-CM

## 2016-02-10 DIAGNOSIS — L0390 Cellulitis, unspecified: Secondary | ICD-10-CM | POA: Diagnosis not present

## 2016-02-10 DIAGNOSIS — K567 Ileus, unspecified: Secondary | ICD-10-CM | POA: Diagnosis not present

## 2016-02-10 DIAGNOSIS — R1032 Left lower quadrant pain: Secondary | ICD-10-CM | POA: Diagnosis present

## 2016-02-10 HISTORY — PX: LAPAROSCOPIC PARTIAL COLECTOMY: SHX5907

## 2016-02-10 LAB — TYPE AND SCREEN
ABO/RH(D): O NEG
Antibody Screen: NEGATIVE

## 2016-02-10 SURGERY — LAPAROSCOPIC PARTIAL COLECTOMY
Anesthesia: General | Site: Abdomen

## 2016-02-10 MED ORDER — FENTANYL CITRATE (PF) 100 MCG/2ML IJ SOLN
INTRAMUSCULAR | Status: DC | PRN
  Administered 2016-02-10 (×8): 50 ug via INTRAVENOUS

## 2016-02-10 MED ORDER — FENTANYL CITRATE (PF) 100 MCG/2ML IJ SOLN
INTRAMUSCULAR | Status: AC
  Filled 2016-02-10: qty 2

## 2016-02-10 MED ORDER — CEFOTETAN DISODIUM-DEXTROSE 2-2.08 GM-% IV SOLR
INTRAVENOUS | Status: AC
  Filled 2016-02-10: qty 50

## 2016-02-10 MED ORDER — SUGAMMADEX SODIUM 500 MG/5ML IV SOLN
INTRAVENOUS | Status: DC | PRN
  Administered 2016-02-10: 200 mg via INTRAVENOUS

## 2016-02-10 MED ORDER — MIDAZOLAM HCL 5 MG/5ML IJ SOLN
INTRAMUSCULAR | Status: DC | PRN
  Administered 2016-02-10 (×4): 1 mg via INTRAVENOUS

## 2016-02-10 MED ORDER — HYDROMORPHONE HCL 1 MG/ML IJ SOLN
INTRAMUSCULAR | Status: AC
  Filled 2016-02-10: qty 1

## 2016-02-10 MED ORDER — HYDROMORPHONE HCL 1 MG/ML IJ SOLN
INTRAMUSCULAR | Status: DC | PRN
  Administered 2016-02-10: 0.5 mg via INTRAVENOUS
  Administered 2016-02-10: 1 mg via INTRAVENOUS
  Administered 2016-02-10: 0.5 mg via INTRAVENOUS

## 2016-02-10 MED ORDER — LIDOCAINE 2% (20 MG/ML) 5 ML SYRINGE
INTRAMUSCULAR | Status: AC
  Filled 2016-02-10: qty 5

## 2016-02-10 MED ORDER — DEXAMETHASONE SODIUM PHOSPHATE 10 MG/ML IJ SOLN
INTRAMUSCULAR | Status: DC | PRN
  Administered 2016-02-10: 10 mg via INTRAVENOUS

## 2016-02-10 MED ORDER — ONDANSETRON HCL 4 MG/2ML IJ SOLN
INTRAMUSCULAR | Status: DC | PRN
  Administered 2016-02-10: 4 mg via INTRAVENOUS

## 2016-02-10 MED ORDER — HYDROMORPHONE 1 MG/ML IV SOLN
INTRAVENOUS | Status: DC
Start: 2016-02-10 — End: 2016-02-11
  Administered 2016-02-10: 20:00:00 via INTRAVENOUS
  Administered 2016-02-11 (×2): 1.8 mg via INTRAVENOUS

## 2016-02-10 MED ORDER — SODIUM CHLORIDE 0.9% FLUSH: 9.0000 mL | INTRAVENOUS | Status: DC | PRN

## 2016-02-10 MED ORDER — SUGAMMADEX SODIUM 200 MG/2ML IV SOLN
INTRAVENOUS | Status: AC
  Filled 2016-02-10: qty 2

## 2016-02-10 MED ORDER — ACETAMINOPHEN 10 MG/ML IV SOLN
INTRAVENOUS | Status: AC
  Filled 2016-02-10: qty 100

## 2016-02-10 MED ORDER — ROCURONIUM BROMIDE 50 MG/5ML IV SOSY
PREFILLED_SYRINGE | INTRAVENOUS | Status: AC
  Filled 2016-02-10: qty 5

## 2016-02-10 MED ORDER — MIDAZOLAM HCL 2 MG/2ML IJ SOLN
INTRAMUSCULAR | Status: AC
  Filled 2016-02-10: qty 2

## 2016-02-10 MED ORDER — 0.9 % SODIUM CHLORIDE (POUR BTL) OPTIME
TOPICAL | Status: DC | PRN
  Administered 2016-02-10: 2000 mL

## 2016-02-10 MED ORDER — LACTATED RINGERS IV SOLN
INTRAVENOUS | Status: DC
  Administered 2016-02-10: 1000 mL via INTRAVENOUS
  Administered 2016-02-10 (×3): via INTRAVENOUS

## 2016-02-10 MED ORDER — PANTOPRAZOLE SODIUM 40 MG PO TBEC
40.0000 mg | DELAYED_RELEASE_TABLET | Freq: Every day | ORAL | Status: DC
  Administered 2016-02-11 – 2016-02-18 (×8): 40 mg via ORAL
  Filled 2016-02-10 (×8): qty 1

## 2016-02-10 MED ORDER — LIDOCAINE HCL (CARDIAC) 20 MG/ML IV SOLN
INTRAVENOUS | Status: DC | PRN
  Administered 2016-02-10: 8 mg via INTRAVENOUS

## 2016-02-10 MED ORDER — ALVIMOPAN 12 MG PO CAPS
12.0000 mg | ORAL_CAPSULE | Freq: Once | ORAL | Status: AC
  Administered 2016-02-10: 12 mg via ORAL
  Filled 2016-02-10: qty 1

## 2016-02-10 MED ORDER — DIPHENHYDRAMINE HCL 12.5 MG/5ML PO ELIX
12.5000 mg | ORAL_SOLUTION | Freq: Four times a day (QID) | ORAL | Status: DC | PRN
  Administered 2016-02-14 – 2016-02-17 (×3): 12.5 mg via ORAL
  Filled 2016-02-10 (×3): qty 5

## 2016-02-10 MED ORDER — ONDANSETRON HCL 4 MG PO TABS: 4.0000 mg | ORAL_TABLET | Freq: Four times a day (QID) | ORAL | Status: DC | PRN

## 2016-02-10 MED ORDER — FENTANYL CITRATE (PF) 100 MCG/2ML IJ SOLN
INTRAMUSCULAR | Status: AC
  Filled 2016-02-10: qty 4

## 2016-02-10 MED ORDER — NALOXONE HCL 0.4 MG/ML IJ SOLN: 0.4000 mg | INTRAMUSCULAR | Status: DC | PRN

## 2016-02-10 MED ORDER — PROPOFOL 10 MG/ML IV BOLUS
INTRAVENOUS | Status: DC | PRN
  Administered 2016-02-10: 180 mg via INTRAVENOUS
  Administered 2016-02-10: 20 mg via INTRAVENOUS

## 2016-02-10 MED ORDER — HYDROMORPHONE HCL 1 MG/ML IJ SOLN
0.2500 mg | INTRAMUSCULAR | Status: DC | PRN
Start: 2016-02-10 — End: 2016-02-10
  Administered 2016-02-10 (×3): 0.5 mg via INTRAVENOUS

## 2016-02-10 MED ORDER — SODIUM CHLORIDE 0.9 % IJ SOLN
INTRAMUSCULAR | Status: AC
  Filled 2016-02-10: qty 50

## 2016-02-10 MED ORDER — SUCCINYLCHOLINE CHLORIDE 20 MG/ML IJ SOLN
INTRAMUSCULAR | Status: AC
  Filled 2016-02-10: qty 1

## 2016-02-10 MED ORDER — ENOXAPARIN SODIUM 40 MG/0.4ML ~~LOC~~ SOLN
40.0000 mg | SUBCUTANEOUS | Status: DC
  Administered 2016-02-11 – 2016-02-19 (×9): 40 mg via SUBCUTANEOUS
  Filled 2016-02-10 (×9): qty 0.4

## 2016-02-10 MED ORDER — ACETAMINOPHEN 10 MG/ML IV SOLN
INTRAVENOUS | Status: DC | PRN
  Administered 2016-02-10 (×2): 1000 mg via INTRAVENOUS

## 2016-02-10 MED ORDER — CHLORHEXIDINE GLUCONATE CLOTH 2 % EX PADS
6.0000 | MEDICATED_PAD | Freq: Once | CUTANEOUS | Status: DC
Start: 2016-02-10 — End: 2016-02-10

## 2016-02-10 MED ORDER — HYDROMORPHONE HCL 2 MG/ML IJ SOLN
INTRAMUSCULAR | Status: AC
  Filled 2016-02-10: qty 1

## 2016-02-10 MED ORDER — OXYCODONE-ACETAMINOPHEN 5-325 MG PO TABS
1.0000 | ORAL_TABLET | ORAL | Status: DC | PRN
  Administered 2016-02-12 (×4): 1 via ORAL
  Administered 2016-02-13 – 2016-02-17 (×27): 2 via ORAL
  Administered 2016-02-18: 1 via ORAL
  Administered 2016-02-18 – 2016-02-19 (×7): 2 via ORAL
  Filled 2016-02-10 (×10): qty 2
  Filled 2016-02-10: qty 1
  Filled 2016-02-10 (×8): qty 2
  Filled 2016-02-10: qty 1
  Filled 2016-02-10 (×2): qty 2
  Filled 2016-02-10 (×2): qty 1
  Filled 2016-02-10 (×6): qty 2
  Filled 2016-02-10: qty 1
  Filled 2016-02-10 (×6): qty 2
  Filled 2016-02-10: qty 1
  Filled 2016-02-10 (×3): qty 2

## 2016-02-10 MED ORDER — DIPHENHYDRAMINE HCL 50 MG/ML IJ SOLN
12.5000 mg | Freq: Four times a day (QID) | INTRAMUSCULAR | Status: DC | PRN
  Administered 2016-02-12 – 2016-02-17 (×9): 12.5 mg via INTRAVENOUS
  Filled 2016-02-10 (×12): qty 1

## 2016-02-10 MED ORDER — BUPIVACAINE LIPOSOME 1.3 % IJ SUSP
20.0000 mL | Freq: Once | INTRAMUSCULAR | Status: AC
  Administered 2016-02-10: 20 mL
  Filled 2016-02-10: qty 20

## 2016-02-10 MED ORDER — DEXTROSE 5 % IV SOLN
2.0000 g | INTRAVENOUS | Status: AC
  Administered 2016-02-10 (×2): 2 g via INTRAVENOUS
  Filled 2016-02-10: qty 2

## 2016-02-10 MED ORDER — KCL IN DEXTROSE-NACL 20-5-0.9 MEQ/L-%-% IV SOLN
INTRAVENOUS | Status: DC
  Administered 2016-02-10 – 2016-02-11 (×2): via INTRAVENOUS
  Filled 2016-02-10 (×2): qty 1000

## 2016-02-10 MED ORDER — CHLORHEXIDINE GLUCONATE CLOTH 2 % EX PADS: 6.0000 | MEDICATED_PAD | Freq: Once | CUTANEOUS | Status: DC

## 2016-02-10 MED ORDER — PROPOFOL 10 MG/ML IV BOLUS
INTRAVENOUS | Status: AC
  Filled 2016-02-10: qty 20

## 2016-02-10 MED ORDER — LACTATED RINGERS IR SOLN
Status: DC | PRN
  Administered 2016-02-10: 1000 mL

## 2016-02-10 MED ORDER — SODIUM CHLORIDE 0.9 % IJ SOLN
INTRAMUSCULAR | Status: DC | PRN
  Administered 2016-02-10: 40 mL

## 2016-02-10 MED ORDER — BUPIVACAINE HCL 0.5 % IJ SOLN
INTRAMUSCULAR | Status: DC | PRN
  Administered 2016-02-10: 15 mL

## 2016-02-10 MED ORDER — BUPIVACAINE HCL (PF) 0.5 % IJ SOLN
INTRAMUSCULAR | Status: AC
Start: 2016-02-10 — End: 2016-02-10
  Filled 2016-02-10: qty 30

## 2016-02-10 MED ORDER — ONDANSETRON HCL 4 MG/2ML IJ SOLN: 4.0000 mg | Freq: Four times a day (QID) | INTRAMUSCULAR | Status: DC | PRN

## 2016-02-10 MED ORDER — ROCURONIUM BROMIDE 100 MG/10ML IV SOLN
INTRAVENOUS | Status: DC | PRN
  Administered 2016-02-10 (×7): 10 mg via INTRAVENOUS
  Administered 2016-02-10: 20 mg via INTRAVENOUS
  Administered 2016-02-10 (×2): 10 mg via INTRAVENOUS
  Administered 2016-02-10: 20 mg via INTRAVENOUS
  Administered 2016-02-10: 10 mg via INTRAVENOUS
  Administered 2016-02-10: 50 mg via INTRAVENOUS

## 2016-02-10 MED ORDER — ONDANSETRON HCL 4 MG/2ML IJ SOLN
4.0000 mg | Freq: Four times a day (QID) | INTRAMUSCULAR | Status: DC | PRN
  Administered 2016-02-10 – 2016-02-15 (×11): 4 mg via INTRAVENOUS
  Filled 2016-02-10 (×11): qty 2

## 2016-02-10 MED ORDER — HYDROMORPHONE 1 MG/ML IV SOLN
INTRAVENOUS | Status: AC
  Filled 2016-02-10: qty 25

## 2016-02-10 SURGICAL SUPPLY — 81 items
APPLIER CLIP ROT 10 11.4 M/L (STAPLE)
BLADE EXTENDED COATED 6.5IN (ELECTRODE) IMPLANT
CABLE HIGH FREQUENCY MONO STRZ (ELECTRODE) IMPLANT
CELLS DAT CNTRL 66122 CELL SVR (MISCELLANEOUS) IMPLANT
CHLORAPREP W/TINT 26ML (MISCELLANEOUS) ×2 IMPLANT
CLIP APPLIE ROT 10 11.4 M/L (STAPLE) IMPLANT
COVER SURGICAL LIGHT HANDLE (MISCELLANEOUS) ×2 IMPLANT
CUTTER FLEX LINEAR 45M (STAPLE) ×2 IMPLANT
DECANTER SPIKE VIAL GLASS SM (MISCELLANEOUS) ×2 IMPLANT
DERMABOND ADVANCED (GAUZE/BANDAGES/DRESSINGS)
DERMABOND ADVANCED .7 DNX12 (GAUZE/BANDAGES/DRESSINGS) IMPLANT
DRAIN CHANNEL 19F RND (DRAIN) IMPLANT
DRAPE LAPAROSCOPIC ABDOMINAL (DRAPES) ×2 IMPLANT
DRAPE UTILITY XL STRL (DRAPES) ×2 IMPLANT
DRSG OPSITE POSTOP 4X10 (GAUZE/BANDAGES/DRESSINGS) IMPLANT
DRSG OPSITE POSTOP 4X12 (GAUZE/BANDAGES/DRESSINGS) ×2 IMPLANT
DRSG OPSITE POSTOP 4X6 (GAUZE/BANDAGES/DRESSINGS) IMPLANT
DRSG OPSITE POSTOP 4X8 (GAUZE/BANDAGES/DRESSINGS) ×2 IMPLANT
DRSG TEGADERM 2-3/8X2-3/4 SM (GAUZE/BANDAGES/DRESSINGS) ×4 IMPLANT
ELECT BLADE TIP CTD 4 INCH (ELECTRODE) ×2 IMPLANT
ELECT PENCIL ROCKER SW 15FT (MISCELLANEOUS) ×4 IMPLANT
ELECT REM PT RETURN 9FT ADLT (ELECTROSURGICAL) ×2
ELECTRODE REM PT RTRN 9FT ADLT (ELECTROSURGICAL) ×1 IMPLANT
GAUZE SPONGE 4X4 12PLY STRL (GAUZE/BANDAGES/DRESSINGS) IMPLANT
GLOVE BIOGEL PI IND STRL 7.5 (GLOVE) ×2 IMPLANT
GLOVE BIOGEL PI INDICATOR 7.5 (GLOVE) ×2
GLOVE ECLIPSE 7.5 STRL STRAW (GLOVE) ×4 IMPLANT
GOWN STRL REUS W/TWL XL LVL3 (GOWN DISPOSABLE) ×8 IMPLANT
GRASPER ENDOPATH ANVIL 10MM (MISCELLANEOUS) ×2 IMPLANT
HANDLE SUCTION POOLE (INSTRUMENTS) ×1 IMPLANT
IRRIG SUCT STRYKERFLOW 2 WTIP (MISCELLANEOUS) ×2
IRRIGATION SUCT STRKRFLW 2 WTP (MISCELLANEOUS) ×1 IMPLANT
LEGGING LITHOTOMY PAIR STRL (DRAPES) ×2 IMPLANT
PACK COLON (CUSTOM PROCEDURE TRAY) ×2 IMPLANT
PACK GENERAL/GYN (CUSTOM PROCEDURE TRAY) ×2 IMPLANT
PAD POSITIONING PINK XL (MISCELLANEOUS) IMPLANT
PORT LAP GEL ALEXIS MED 5-9CM (MISCELLANEOUS) ×2 IMPLANT
POSITIONER SURGICAL ARM (MISCELLANEOUS) IMPLANT
RELOAD 45 VASCULAR/THIN (ENDOMECHANICALS) ×2 IMPLANT
RELOAD BLUE CHELON 45 (STAPLE) IMPLANT
RELOAD PROXIMATE 75MM BLUE (ENDOMECHANICALS) ×2 IMPLANT
RELOAD STAPLER BLUE 60MM (STAPLE) ×1 IMPLANT
RETRACTOR WND ALEXIS 25 LRG (MISCELLANEOUS) ×1 IMPLANT
RTRCTR WOUND ALEXIS 18CM MED (MISCELLANEOUS)
RTRCTR WOUND ALEXIS 25CM LRG (MISCELLANEOUS) ×2
SCISSORS LAP 5X35 DISP (ENDOMECHANICALS) ×2 IMPLANT
SEALER TISSUE X1 CVD JAW (INSTRUMENTS) ×2 IMPLANT
SHEARS HARMONIC ACE PLUS 36CM (ENDOMECHANICALS) IMPLANT
SLEEVE ADV FIXATION 5X100MM (TROCAR) ×6 IMPLANT
SOLUTION ANTI FOG 6CC (MISCELLANEOUS) ×2 IMPLANT
SPONGE LAP 18X18 X RAY DECT (DISPOSABLE) ×4 IMPLANT
STAPLE ECHEON FLEX 60 POW ENDO (STAPLE) ×2 IMPLANT
STAPLER CIRC CVD 29MM 37CM (STAPLE) ×2 IMPLANT
STAPLER CIRC ILS CVD 25MM (STAPLE) ×2 IMPLANT
STAPLER CUT CVD 40MM BLUE (STAPLE) ×2 IMPLANT
STAPLER PROXIMATE 75MM BLUE (STAPLE) ×2 IMPLANT
STAPLER RELOAD BLUE 60MM (STAPLE) ×2
STAPLER VISISTAT 35W (STAPLE) ×2 IMPLANT
SUCTION POOLE HANDLE (INSTRUMENTS) ×2
SUT PDS AB 0 CT1 36 (SUTURE) IMPLANT
SUT PDS AB 1 CTX 36 (SUTURE) IMPLANT
SUT PDS AB 1 TP1 96 (SUTURE) IMPLANT
SUT PROLENE 2 0 KS (SUTURE) ×2 IMPLANT
SUT PROLENE 3 0 SH 48 (SUTURE) IMPLANT
SUT SILK 2 0 (SUTURE) ×1
SUT SILK 2 0 SH CR/8 (SUTURE) ×2 IMPLANT
SUT SILK 2-0 18XBRD TIE 12 (SUTURE) ×1 IMPLANT
SUT SILK 3 0 (SUTURE) ×1
SUT SILK 3 0 SH CR/8 (SUTURE) ×2 IMPLANT
SUT SILK 3-0 18XBRD TIE 12 (SUTURE) ×1 IMPLANT
SYS LAPSCP GELPORT 120MM (MISCELLANEOUS) ×2
SYSTEM LAPSCP GELPORT 120MM (MISCELLANEOUS) ×1 IMPLANT
TAPE CLOTH 4X10 WHT NS (GAUZE/BANDAGES/DRESSINGS) IMPLANT
TOWEL OR 17X26 10 PK STRL BLUE (TOWEL DISPOSABLE) ×4 IMPLANT
TOWEL OR NON WOVEN STRL DISP B (DISPOSABLE) ×4 IMPLANT
TRAY FOLEY W/METER SILVER 16FR (SET/KITS/TRAYS/PACK) ×2 IMPLANT
TROCAR ADV FIXATION 12X100MM (TROCAR) IMPLANT
TROCAR ADV FIXATION 5X100MM (TROCAR) ×2 IMPLANT
TROCAR BLADELESS OPT 5 100 (ENDOMECHANICALS) ×2 IMPLANT
TROCAR XCEL BLUNT TIP 100MML (ENDOMECHANICALS) ×2 IMPLANT
TUBING INSUF HEATED (TUBING) ×2 IMPLANT

## 2016-02-10 NOTE — Transfer of Care (Signed)
Immediate Anesthesia Transfer of Care Note  Patient: Mario CharonAdam Ford  Procedure(s) Performed: Procedure(s): LAPAROSCOPIC LEFT HEMI COLECTOMY CONVERTED TO OPEN SUBTOTAL COLECTOMY (N/A)  Patient Location: PACU  Anesthesia Type:General  Level of Consciousness: awake, alert  and oriented  Airway & Oxygen Therapy: Patient Spontanous Breathing and Patient connected to face mask oxygen  Post-op Assessment: Report given to RN and Post -op Vital signs reviewed and stable  Post vital signs: Reviewed and stable  Last Vitals:  Vitals:   02/10/16 1050 02/10/16 1120  BP: (!) 146/107 (!) 164/95  Pulse: (!) 109 (!) 107  Resp: 18   Temp: 36.9 C     Last Pain:  Vitals:   02/10/16 1212  PainSc: 6       Patients Stated Pain Goal: 4 (02/10/16 1212)  Complications: No apparent anesthesia complications

## 2016-02-10 NOTE — H&P (Signed)
History of Present Illness Mario Ford(Mario Ford T. Mario Mollett MD; 01/22/2016 3:03 PM) The patient is a 35 year old male who presents with diverticulitis. He is referred by Dr. Marina Ford for recurring diverticulitis. The patient states he has had some chronic GI symptoms for a number of years, possibly related to irritable bowel, consisting of intermittent diarrhea or cramping or nausea. At one point in 2013 he had a CT scan to evaluate the symptoms and had noted extensive diverticulosis. However in March 2016 he developed acute sigmoid diverticulitis that was confirmed on CT scan. This was treated with antibiotics as an outpatient. He had several flareups of this over the next few months in 2016. It then got a little bit better for a while but he had another severe episode probably early 2017 also treated as an outpatient with antibiotics. Since then he has had intermittent left lower quadrant abdominal pain. Overall he has been treated 5 or 6 times for presumed acute diverticulitis and was recently on Cipro prescribed by Dr. Marina Ford. He watches closely what he eats, eating small amounts which she feels can head off attacks. His episodes have been typical with constant left lower quadrant abdominal pain associated with fever and some change in bowel habits. He had a complete colonoscopy in August 2016 which revealed extensive diverticulosis throughout the entire colon but more concentrated on the left. He is frustrated with recurrent episodes and concerned about future complications of diverticulitis at his age.  I reviewed his previous 2 [place] 2013 showing scattered diverticulosis and then again in March 2016 showing focal pericolonic stranding and colonic wall thickening involving the sigmoid colon with an enlarged apparently inflamed diverticulum seen in this area.   Other Problems Mario Ford(Mario Ford, CMA; 01/22/2016 2:43 PM) Anxiety Disorder Back Pain Diverticulosis Enlarged  Prostate Gastroesophageal Reflux Disease Hemorrhoids  Past Surgical History Mario Ford(Mario Ford, CMA; 01/22/2016 2:43 PM) Appendectomy Knee Surgery Left. Tonsillectomy  Diagnostic Studies History Mario Ford(Mario Ford, CMA; 01/22/2016 2:43 PM) Colonoscopy within last year  Allergies Mario Ford(Mario Ford, CMA; 01/22/2016 2:44 PM) Sulfa Antibiotics  Medication History (Mario Ford, CMA; 01/22/2016 2:45 PM) Omeprazole (10MG  Capsule DR, Oral) Active. Multivitamin Adults (Oral) Active. Medications Reconciled  Social History Mario Ford(Mario Ford, CMA; 01/22/2016 2:43 PM) Alcohol use Occasional alcohol use. Caffeine use Carbonated beverages, Coffee, Tea. No drug use Tobacco use Never smoker.  Family History Mario Ford(Mario Ford, CMA; 01/22/2016 2:43 PM) Cerebrovascular Accident Mother. Melanoma Father. Respiratory Condition Mother.    Review of Systems Mario Ford(Mario Ford CMA; 01/22/2016 2:43 PM) General Not Present- Appetite Loss, Chills, Fatigue, Fever, Night Sweats, Weight Gain and Weight Loss. Skin Not Present- Change in Wart/Mole, Dryness, Hives, Jaundice, New Lesions, Non-Healing Wounds, Rash and Ulcer. HEENT Not Present- Earache, Hearing Loss, Hoarseness, Nose Bleed, Oral Ulcers, Ringing in the Ears, Seasonal Allergies, Sinus Pain, Sore Throat, Visual Disturbances, Wears glasses/contact lenses and Yellow Eyes. Respiratory Not Present- Bloody sputum, Chronic Cough, Difficulty Breathing, Snoring and Wheezing. Breast Not Present- Breast Mass, Breast Pain, Nipple Discharge and Skin Changes. Cardiovascular Not Present- Chest Pain, Difficulty Breathing Lying Down, Leg Cramps, Palpitations, Rapid Heart Rate, Shortness of Breath and Swelling of Extremities. Gastrointestinal Not Present- Abdominal Pain, Bloating, Bloody Stool, Change in Bowel Habits, Chronic diarrhea, Constipation, Difficulty Swallowing, Excessive gas, Gets full quickly at meals, Hemorrhoids, Indigestion, Nausea, Rectal Pain and Vomiting. Male  Genitourinary Not Present- Blood in Urine, Change in Urinary Stream, Frequency, Impotence, Nocturia, Painful Urination, Urgency and Urine Leakage. Musculoskeletal Not Present- Back Pain, Joint Pain, Joint Stiffness, Muscle  Pain, Muscle Weakness and Swelling of Extremities. Neurological Not Present- Decreased Memory, Fainting, Headaches, Numbness, Seizures, Tingling, Tremor, Trouble walking and Weakness. Psychiatric Not Present- Anxiety, Bipolar, Change in Sleep Pattern, Depression, Fearful and Frequent crying. Endocrine Not Present- Cold Intolerance, Excessive Hunger, Hair Changes, Heat Intolerance, Hot flashes and New Diabetes. Hematology Not Present- Blood Thinners, Easy Bruising, Excessive bleeding, Gland problems, HIV and Persistent Infections.  Vitals (Mario Ford CMA; 01/22/2016 2:44 PM) 01/22/2016 2:43 PM Weight: 204 lb Height: 67in Body Surface Area: 2.04 m Body Mass Index: 31.95 kg/m  Temp.: 97.77F(Temporal)  Pulse: 81 (Regular)  BP: 126/76 (Sitting, Left Arm, Standard)       Physical Exam Mario Ford(Mario Campau T. Mario Seder MD; 01/22/2016 3:04 PM) The physical exam findings are as follows: Note:General: Alert, well-developed and well nourished Caucasian male, in no distress Skin: Warm and dry without rash or infection. HEENT: No palpable masses or thyromegaly. Sclera nonicteric. Pupils equal round and reactive. Lymph nodes: No cervical, supraclavicular, or inguinal nodes palpable. Lungs: Breath sounds clear and equal. No wheezing or increased work of breathing. Cardiovascular: Regular rate and rhythm without murmer. No JVD or edema. Peripheral pulses intact. No carotid bruits. Abdomen: Nondistended. Soft with mild left lower quadrant tenderness. No masses palpable. No organomegaly. No palpable hernias. Extremities: No edema or joint swelling or deformity. No chronic venous stasis changes. Neurologic: Alert and fully oriented. Gait normal. No focal weakness. Psychiatric:  Normal mood and affect. Thought content appropriate with normal judgement and insight    Assessment & Plan Mario Ford(Kenard Morawski T. Jennings Stirling MD; 01/22/2016 3:27 PM) SIGMOID DIVERTICULITIS (K57.32) Impression: 35 year old male with initial attack of documented sigmoid diverticulitis seen on CT scan in March 2016. He subsequently has been treated for 4 or 5 further episodes requiring antibiotics and has not been free of symptoms for more than a month or 2 at a time. He also has what sounds like some chronic irritable bowel symptoms in terms of nausea and bloating and occasional change in bowel habits that may or may not be related to his diverticulitis and might not improve with surgery. I discussed all this in detail with the patient and his wife. We discussed options of continued monitoring versus surgical resection. I think at his age and with the frequency of attacks he is having he is unlikely to do well without surgery. We discussed surgery in detail including its nature and expected results as well as risks of anesthetic complications, bleeding, infection, possible need for open surgery, possible anastomotic leak or need for temporary ostomy. There were given literature regarding the procedure. After discussion they would like to proceed with resection which I think is most reasonable. Of note his sister and nephew are diagnosed with factor V Leiden deficiency although he has no history of any abnormal bleeding or thrombosis. I will ask our hematologist if he should be tested preoperatively. Current Plans Laparoscopic left colectomy under general anesthesia following a mechanical and antibiotic bowel prep at home

## 2016-02-10 NOTE — Anesthesia Procedure Notes (Signed)
Procedure Name: Intubation Date/Time: 02/10/2016 12:38 PM Performed by: Gaynelle AduFITZGERALD, WILLIAM Pre-anesthesia Checklist: Patient identified, Emergency Drugs available, Suction available, Patient being monitored and Timeout performed Patient Re-evaluated:Patient Re-evaluated prior to inductionOxygen Delivery Method: Circle system utilized Preoxygenation: Pre-oxygenation with 100% oxygen Intubation Type: IV induction and Cricoid Pressure applied Laryngoscope Size: Mac and 4 Grade View: Grade II Tube type: Oral Tube size: 7.5 mm Number of attempts: 1 Airway Equipment and Method: Stylet Placement Confirmation: ETT inserted through vocal cords under direct vision,  positive ETCO2 and breath sounds checked- equal and bilateral Secured at: 21 cm Tube secured with: Tape Dental Injury: Teeth and Oropharynx as per pre-operative assessment  Comments: Slightly anterior. Cords visualized with head lift/cricoid pressure.

## 2016-02-10 NOTE — Interval H&P Note (Signed)
History and Physical Interval Note:  02/10/2016 12:05 PM  [name]  has presented today for surgery, with the diagnosis of DIVERTICULITIS  The various methods of treatment have been discussed with the patient and family. After consideration of risks, benefits and other options for treatment, the patient has consented to  Procedure(s): LAPAROSCOPIC PARTIAL COLECTOMY (N/A) as a surgical intervention .  The patient's history has been reviewed, patient examined, no change in status, stable for surgery.  I have reviewed the patient's chart and labs.  Questions were answered to the patient's satisfaction.     Ardra Kuznicki T

## 2016-02-10 NOTE — Progress Notes (Addendum)
Mario Ford is a 35 y.o. male patient admitted from OR drowsy, responds to voice - oriented  X 4 - no acute distress noted.  VSS - Blood pressure 110/68, pulse 82, temperature 98.6 F (37 C), temperature source Oral, resp. rate 16, height 5\' 7"  (1.702 m), weight 90.7 kg (200 lb), SpO2 98 %.    IV in place, occlusive dsg intact without redness.  Orientation to room, and floor completed with information packet given to patient/family.  Patient declined safety video at this time.  Admission INP armband ID verified with patient/family, and in place.   SR up x 2, fall assessment complete, with patient and family able to verbalize understanding of risk associated with falls, and verbalized understanding to call nsg before up out of bed.  Call light within reach, patient able to voice, and demonstrate understanding.  Skin, clean-dry- intact without evidence of bruising, or skin tears.  Surgical incisions assessed. No evidence of skin break down noted on exam.     Will cont to eval and treat per MD orders.  Elissa HeftyPatricia D Ramirez, RN 02/10/2016 09:10pm

## 2016-02-10 NOTE — Op Note (Signed)
Preoperative Diagnosis: DIVERTICULITIS  Postoprative Diagnosis: DIVERTICULITIS  Procedure: Procedure(s): LAPAROSCOPIC LEFT HEMI COLECTOMY CONVERTED TO OPEN SUBTOTAL COLECTOMY   Surgeon: Glenna FellowsHoxworth, Nashla Althoff T   Assistants: Twana Firsthelsea Conner  Anesthesia:  General endotracheal anesthesia  Indications: Patient is a 35 year old male with repeated episodes of sigmoid diverticulitis and ongoing symptoms. He has had colonoscopy and imaging studies that confirmed left and sigmoid diverticulitis with extensive diverticulosis throughout the left colon and scattered diverticula throughout the entire colon. After extensive preoperative workup and discussion detailed elsewhere we have elected to proceed with laparoscopic left hemicolectomy to relieve ongoing symptoms and hopefully prevent future complications from his diverticulitis. The nature of the surgery and indications and risks have been discussed extensively with the patient and his family in detailed elsewhere.    Procedure Detail:  He had undergone a mechanical and antibiotic bowel prep at home. He was taken to the operating room, placed in the supine position on the operating table and general endotracheal anesthesia induced. He received preoperative IV antibiotics. Orogastric tube and Foley catheter were placed. PAS were placed. He was carefully positioned and padded in semi-lithotomy position and the perineum and abdomen widely sterilely prepped and draped. Patient timeout was performed and correct procedure verified. Access was obtained with a 5 mm trocar in the right mid abdomen and pneumoperitoneum established. No evidence of trocar injury. Under direct vision a 5 mm trocar was placed just beneath the umbilicus for the camera port, a 12 mm trocar in the right lower quadrant and 5 mm trochars in the left lower quadrant and left mid abdomen. There was noted to be a very large omentum and a large amount of intra-abdominal fat for his size. The omentum  was mobilized from its lesions in the pelvis and the omentum and small bowel displaced into the upper abdomen with the patient in Trendelenburg. There was evidence of significant diverticulitis with thickening of the sigmoid colon and adhesions of the sigmoid up to the left lateral pelvic sidewall. There were small bowel adhesions to the sigmoid and mesosigmoid were carefully mobilized and displaced. Initially I examined the medial aspect of the sigmoid mesentery but it was extremely thick and friable and I elected to approach the dissection laterally. Some inflammatory adhesions were taken down from the lateral pelvic sidewall and then peritoneal attachments of the distal left and sigmoid colon were divided with Harmonic scalpel and the colon and mesentery dissected medially. The ureter was clearly identified and dissected out over a portion and carefully protected throughout the remainder of the dissection. With the ureter and iliac vessels identified I then could dissect medially and the mesentery and the pre-peritoneal space entered and mesentery dissected medially off and away from the ureter. The mesentery of the sigmoid colon was then further divided with the Harmonic scalpel and the inferior mesenteric pedicle was taken away from its origin with a single firing of the white load stapler. Following this the left colon was extensively mobilized dividing lateral peritoneal attachments and dissection carried up toward the splenic flexure which was mobilized. The mesentery was elevated and dissected off of the transverse colon with the Harmonic scalpel and after dissecting through large amount of fatty tissue around the omentum the lesser sac was entered and the transverse colon extensively mobilized away from the omentum and the lesser omentum divided completely mobilizing the transverse colon working over toward the splenic flexure. Following this the splenic flexure could be completely mobilized off of  Gerota's fascia. A point of division of the rectosigmoid  a few centimeters above the peritoneal reflection where the tinea disappeared was chosen and this was dissected on either side opening the mesentery and I bluntly dissected behind the rectosigmoid which was then divided with a single firing of the 60 mm echelon blue load stapler. The mesentery of the more proximal rectosigmoid was then sequentially divided with the Harmonic scalpel keeping the ureter in view until the dissection connected with the previous sigmoid dissection. The left lower quadrant trocar site was then enlarged to about 6 cm as an extraction incision and wound protector placed. The specimen was brought out through the extraction site and although we had achieved what seemed like complete mobilization the sigmoid came down with a little more tension than I had hoped for. However it seemed adequate and this was cleaned of pericolic fat and mesentery and sized to a 29 mm anvil which was placed with a pursestring suture. This was then reintroduced into the abdomen and Dr. Fredricka Bonine was able to pass the 29 mm stapler transanally to the rectal staple line and the spike was deployed, the anvil attached and the stapler closed with seemingly minimal tension. The scope was fired and removed and we had 2 intact donuts. However with insufflation and clamping there was an air leak and we could see a small opening on the anterior portion of the anastomosis and on more careful inspection I felt that this was likely secondary to undue tension and I therefore did not want to just repair this and leave it. At this point I felt the best approach was to open the abdomen to see if we can obtain any more mobility and if not I felt the only other recourse was to proceed with a subtotal colectomy. A low midline incision skirting the umbilicus was used. The transverse colon was carefully identified and inspected and in fact we had achieved a complete mobilization up to  the middle colic vessels and were not able to obtain any further length even with an open incision. As planned then proceeded with a subtotal colectomy. The terminal ileum and right colon were mobilized dividing lateral peritoneal attachments. The dissection was carried up around the hepatic flexure and hepatic colic ligament divided using the Enseal. The omentum was further dissected up off the more proximal transverse colon which was fully mobilized. The rectosigmoid was dissected a centimeter or 2 distal to the previous anastomosis and divided with the contour stapler. The distal couple of centimeters of rectosigmoid anastomosis were then further dissected with Harmonic scalpel through the mesentery again protecting the ureter. The terminal ileum was divided with the GIA stapler and the mesentery of the right and proximal transverse colon sequentially divided with the Enseal. No colic vessels were additionally clamped and ligated. The specimen was removed. The abdomen was thoroughly irrigated with saline and there was no bleeding or other evidence of problems. A side to end anastomosis was created between the terminal ileum and the rectosigmoid using a 25 mm anvil placed into the divided end of the ileum and then the post brought out through the antimesenteric side of the ileum and opened and closed with the GIA stapler. A small silk pursestring was placed around the post of the anvil. Dr. Fredricka Bonine then advanced the 25 mm EEA stapler without difficulty to the rectosigmoid staple line and the post was advanced through the midpoint of the staple line the anvil attached and the stapler closed excluding extraneous material and fired and removed. Donuts were again intact. With the  ileum clamped under saline irrigation with tense air distention there was no evidence of leak. Following this the abdomen was again thoroughly irrigated and hemostasis assured. No evidence of injury. All drapes were removed and all gloves  gowns and instruments changed. The abdomen was again inspected. No problems seen. The fascia of the left lower quadrant incision was closed in layers with 0 PDS and the midline incision was closed with looped running #1 PDS beginning around the incision and tied centrally. Simultaneous tissue was irrigated. The fascia been infiltrated with Exparel. Skin incisions were closed with staples. Sponge needle and instrument counts were correct.   Findings: Above  Estimated Blood Loss:  400 mL         Drains: None  Blood Given: none          Specimens: Subtotal colectomy        Complications:  * No complications entered in OR log *         Disposition: PACU - hemodynamically stable.         Condition: stable

## 2016-02-10 NOTE — Anesthesia Preprocedure Evaluation (Addendum)
Anesthesia Evaluation  Patient identified by MRN, date of birth, ID band Patient awake    Reviewed: Allergy & Precautions, H&P , NPO status , Patient's Chart, lab work & pertinent test results  Airway Mallampati: II  TM Distance: >3 FB Neck ROM: Full    Dental no notable dental hx. (+) Teeth Intact, Dental Advisory Given   Pulmonary neg pulmonary ROS,    Pulmonary exam normal breath sounds clear to auscultation       Cardiovascular negative cardio ROS   Rhythm:Regular Rate:Normal     Neuro/Psych negative neurological ROS  negative psych ROS   GI/Hepatic Neg liver ROS, hiatal hernia, GERD  Medicated and Controlled,  Endo/Other  negative endocrine ROS  Renal/GU negative Renal ROS  negative genitourinary   Musculoskeletal   Abdominal   Peds  Hematology negative hematology ROS (+)   Anesthesia Other Findings   Reproductive/Obstetrics negative OB ROS                            Anesthesia Physical Anesthesia Plan  ASA: II  Anesthesia Plan: General   Post-op Pain Management:    Induction: Intravenous  Airway Management Planned: Oral ETT  Additional Equipment:   Intra-op Plan:   Post-operative Plan: Extubation in OR  Informed Consent: I have reviewed the patients History and Physical, chart, labs and discussed the procedure including the risks, benefits and alternatives for the proposed anesthesia with the patient or authorized representative who has indicated his/her understanding and acceptance.   Dental advisory given  Plan Discussed with: CRNA  Anesthesia Plan Comments:         Anesthesia Quick Evaluation  

## 2016-02-11 ENCOUNTER — Encounter (HOSPITAL_COMMUNITY): Payer: Self-pay | Admitting: General Surgery

## 2016-02-11 LAB — CBC
HCT: 39.8 % (ref 39.0–52.0)
Hemoglobin: 13.6 g/dL (ref 13.0–17.0)
MCH: 28.8 pg (ref 26.0–34.0)
MCHC: 34.2 g/dL (ref 30.0–36.0)
MCV: 84.1 fL (ref 78.0–100.0)
PLATELETS: 307 10*3/uL (ref 150–400)
RBC: 4.73 MIL/uL (ref 4.22–5.81)
RDW: 13.1 % (ref 11.5–15.5)
WBC: 16.4 10*3/uL — ABNORMAL HIGH (ref 4.0–10.5)

## 2016-02-11 LAB — BASIC METABOLIC PANEL
ANION GAP: 6 (ref 5–15)
BUN: 10 mg/dL (ref 6–20)
CALCIUM: 8.4 mg/dL — AB (ref 8.9–10.3)
CO2: 27 mmol/L (ref 22–32)
CREATININE: 1.03 mg/dL (ref 0.61–1.24)
Chloride: 104 mmol/L (ref 101–111)
Glucose, Bld: 190 mg/dL — ABNORMAL HIGH (ref 65–99)
Potassium: 4.6 mmol/L (ref 3.5–5.1)
SODIUM: 137 mmol/L (ref 135–145)

## 2016-02-11 MED ORDER — LORAZEPAM 2 MG/ML IJ SOLN
INTRAMUSCULAR | Status: AC
  Filled 2016-02-11: qty 1

## 2016-02-11 MED ORDER — HYDROMORPHONE HCL 1 MG/ML IJ SOLN
1.0000 mg | INTRAMUSCULAR | Status: DC | PRN
  Administered 2016-02-11 (×4): 2 mg via INTRAVENOUS
  Administered 2016-02-11: 1 mg via INTRAVENOUS
  Administered 2016-02-11 – 2016-02-12 (×14): 2 mg via INTRAVENOUS
  Administered 2016-02-12: 1 mg via INTRAVENOUS
  Administered 2016-02-12 – 2016-02-13 (×7): 2 mg via INTRAVENOUS
  Administered 2016-02-14 (×2): 1 mg via INTRAVENOUS
  Administered 2016-02-14: 2 mg via INTRAVENOUS
  Administered 2016-02-15: 1 mg via INTRAVENOUS
  Administered 2016-02-15: 2 mg via INTRAVENOUS
  Administered 2016-02-15 – 2016-02-18 (×8): 1 mg via INTRAVENOUS
  Filled 2016-02-11 (×2): qty 2
  Filled 2016-02-11: qty 1
  Filled 2016-02-11: qty 2
  Filled 2016-02-11: qty 1
  Filled 2016-02-11 (×2): qty 2
  Filled 2016-02-11: qty 1
  Filled 2016-02-11 (×2): qty 2
  Filled 2016-02-11 (×3): qty 1
  Filled 2016-02-11 (×5): qty 2
  Filled 2016-02-11 (×3): qty 1
  Filled 2016-02-11 (×2): qty 2
  Filled 2016-02-11 (×2): qty 1
  Filled 2016-02-11 (×7): qty 2
  Filled 2016-02-11: qty 1
  Filled 2016-02-11: qty 2
  Filled 2016-02-11 (×3): qty 1
  Filled 2016-02-11: qty 2
  Filled 2016-02-11: qty 1
  Filled 2016-02-11 (×5): qty 2

## 2016-02-11 MED ORDER — SODIUM CHLORIDE 0.9 % IV BOLUS (SEPSIS)
1000.0000 mL | Freq: Once | INTRAVENOUS | Status: AC
  Administered 2016-02-11: 1000 mL via INTRAVENOUS

## 2016-02-11 MED ORDER — HYDROMORPHONE HCL 1 MG/ML IJ SOLN
0.5000 mg | INTRAMUSCULAR | Status: DC | PRN
  Administered 2016-02-11: 1 mg via INTRAVENOUS
  Administered 2016-02-11: 0.5 mg via INTRAVENOUS
  Administered 2016-02-11 (×2): 1 mg via INTRAVENOUS
  Filled 2016-02-11 (×4): qty 1

## 2016-02-11 MED ORDER — POTASSIUM CHLORIDE IN NACL 20-0.9 MEQ/L-% IV SOLN
INTRAVENOUS | Status: DC
  Administered 2016-02-11 (×2): via INTRAVENOUS
  Administered 2016-02-12: 125 mL/h via INTRAVENOUS
  Administered 2016-02-13 – 2016-02-18 (×9): via INTRAVENOUS
  Administered 2016-02-19: 1000 mL via INTRAVENOUS
  Filled 2016-02-11 (×22): qty 1000

## 2016-02-11 MED ORDER — ALVIMOPAN 12 MG PO CAPS
12.0000 mg | ORAL_CAPSULE | Freq: Two times a day (BID) | ORAL | Status: DC
  Administered 2016-02-11 – 2016-02-14 (×7): 12 mg via ORAL
  Filled 2016-02-11 (×7): qty 1

## 2016-02-11 MED ORDER — LORAZEPAM 2 MG/ML IJ SOLN
0.2500 mg | Freq: Once | INTRAMUSCULAR | Status: AC
  Administered 2016-02-11: 0.25 mg via INTRAVENOUS

## 2016-02-11 NOTE — Progress Notes (Signed)
Dr Johna SheriffHoxworth text paged as patient heart rate continues to be elevated.  1 liter normal saline bolus given with 200 cc urine.  Patient does express that he is anxious. This information was relayed to Dr Johna SheriffHoxworth via OR nurse Waynetta SandyBeth.  Orders received

## 2016-02-11 NOTE — Progress Notes (Signed)
Patient states the PCA is keeping him up, seeming very agitated. Paged Dr. Daphine DeutscherMartin and got an order to DC PCA and start Dilaudid 0.5-1 mg IV .

## 2016-02-11 NOTE — Progress Notes (Signed)
Patient had foley discontinued early am. Patient has not been able to void. MD, Hoxworth paged and received orders to allow patient 4 more hours and then if no output in and out cath.

## 2016-02-11 NOTE — Anesthesia Postprocedure Evaluation (Signed)
Anesthesia Post Note  Patient: Modesto Charondam Nitschke  Procedure(s) Performed: Procedure(s) (LRB): LAPAROSCOPIC LEFT HEMI COLECTOMY CONVERTED TO OPEN SUBTOTAL COLECTOMY (N/A)  Patient location during evaluation: PACU Anesthesia Type: General Level of consciousness: awake and alert Pain management: pain level controlled Vital Signs Assessment: post-procedure vital signs reviewed and stable Respiratory status: spontaneous breathing, nonlabored ventilation, respiratory function stable and patient connected to nasal cannula oxygen Cardiovascular status: blood pressure returned to baseline and stable Postop Assessment: no signs of nausea or vomiting Anesthetic complications: no    Last Vitals:  Vitals:   02/10/16 2310 02/11/16 0000  BP: 110/68 118/75  Pulse: 82 85  Resp: 16 12  Temp: 37 C 36.8 C    Last Pain:  Vitals:   02/11/16 0422  TempSrc:   PainSc: 7                  Myia Bergh,W. EDMOND

## 2016-02-11 NOTE — Progress Notes (Signed)
1 Day Post-Op  Subjective: Having incisional pain. Mild nausea control with medications.  Objective: Vital signs in last 24 hours: Temp:  [98 F (36.7 C)-99 F (37.2 C)] 98.4 F (36.9 C) (10/31 0450) Pulse Rate:  [82-115] 94 (10/31 0450) Resp:  [10-18] 10 (10/31 0645) BP: (110-164)/(68-107) 131/79 (10/31 0450) SpO2:  [94 %-100 %] 98 % (10/31 0645) Weight:  [90.7 kg (200 lb)] 90.7 kg (200 lb) (10/30 1044)    Intake/Output from previous day: 10/30 0701 - 10/31 0700 In: 3943.8 [I.V.:3943.8] Out: 1650 [Urine:1150; Blood:500] Intake/Output this shift: No intake/output data recorded.  General appearance: alert, cooperative and no distress Resp: clear to auscultation bilaterally GI: Moderate appropriate incisional tenderness. Incision/Wound: Dressings clean and dry with minimal bloody drainage at the midline  Lab Results:   Recent Labs  02/11/16 0408  WBC 16.4*  HGB 13.6  HCT 39.8  PLT 307   BMET  Recent Labs  02/11/16 0408  NA 137  K 4.6  CL 104  CO2 27  GLUCOSE 190*  BUN 10  CREATININE 1.03  CALCIUM 8.4*     Studies/Results: No results found.  Anti-infectives: Anti-infectives    Start     Dose/Rate Route Frequency Ordered Stop   02/10/16 1035  cefoTEtan (CEFOTAN) 2 g in dextrose 5 % 50 mL IVPB     2 g 100 mL/hr over 30 Minutes Intravenous On call to O.R. 02/10/16 1035 02/10/16 1830      Assessment/Plan: s/p Procedure(s): LAPAROSCOPIC LEFT HEMI COLECTOMY CONVERTED TO OPEN SUBTOTAL COLECTOMY Stable postoperatively. Pain control not adequate, will increase when necessary Dilaudid dose Hyperglycemia, discontinue glucose and IV fluids. Out of bed today.   LOS: 1 day    Mario Ford 10/31/2017Patient ID: Mario Ford, male   DOB: 12/05/1980, 35 y.o.   MRN: [id number]

## 2016-02-11 NOTE — Progress Notes (Signed)
Dr Johna SheriffHoxworth made aware of patient increased pulse rate and dark urine with total of 350 since 6 am foley removal.  Orders received to bolus

## 2016-02-12 LAB — CBC
HEMATOCRIT: 32 % — AB (ref 39.0–52.0)
Hemoglobin: 10.6 g/dL — ABNORMAL LOW (ref 13.0–17.0)
MCH: 28.3 pg (ref 26.0–34.0)
MCHC: 33.1 g/dL (ref 30.0–36.0)
MCV: 85.6 fL (ref 78.0–100.0)
PLATELETS: 227 10*3/uL (ref 150–400)
RBC: 3.74 MIL/uL — AB (ref 4.22–5.81)
RDW: 13.5 % (ref 11.5–15.5)
WBC: 11.9 10*3/uL — AB (ref 4.0–10.5)

## 2016-02-12 LAB — BASIC METABOLIC PANEL
ANION GAP: 5 (ref 5–15)
BUN: 11 mg/dL (ref 6–20)
CALCIUM: 7.6 mg/dL — AB (ref 8.9–10.3)
CO2: 27 mmol/L (ref 22–32)
Chloride: 105 mmol/L (ref 101–111)
Creatinine, Ser: 0.88 mg/dL (ref 0.61–1.24)
Glucose, Bld: 102 mg/dL — ABNORMAL HIGH (ref 65–99)
POTASSIUM: 3.8 mmol/L (ref 3.5–5.1)
Sodium: 137 mmol/L (ref 135–145)

## 2016-02-12 MED ORDER — LORAZEPAM 2 MG/ML IJ SOLN
0.5000 mg | Freq: Four times a day (QID) | INTRAMUSCULAR | Status: DC | PRN
  Administered 2016-02-12 – 2016-02-16 (×7): 0.5 mg via INTRAVENOUS
  Filled 2016-02-12 (×7): qty 1

## 2016-02-12 MED ORDER — METOPROLOL TARTRATE 5 MG/5ML IV SOLN
5.0000 mg | Freq: Four times a day (QID) | INTRAVENOUS | Status: DC | PRN
  Administered 2016-02-12: 5 mg via INTRAVENOUS
  Filled 2016-02-12: qty 5

## 2016-02-12 MED ORDER — LIP MEDEX EX OINT
TOPICAL_OINTMENT | CUTANEOUS | Status: AC
  Filled 2016-02-12: qty 7

## 2016-02-12 NOTE — Progress Notes (Signed)
Patient ID: Mario Ford, male   DOB: 1980-07-25, 35 y.o.   MRN: [id number]   2 Days Post-Op  Subjective: Still quite a bit of pain, centered around incision, worse with movement, no worse than yesterday. Still some nausea but improved. No flatus/BM. Voiding OK.  Has walked in halls.  Objective: Vital signs in last 24 hours: Temp:  [97.7 F (36.5 C)-100.2 F (37.9 C)] 98.2 F (36.8 C) (11/01 0543) Pulse Rate:  [102-133] 102 (11/01 0543) Resp:  [12-18] 16 (11/01 0543) BP: (123-140)/(63-103) 123/72 (11/01 0543) SpO2:  [94 %-97 %] 97 % (11/01 0135)    Intake/Output from previous day: 10/31 0701 - 11/01 0700 In: 2284.6 [P.O.:120; I.V.:1164.6; IV Piggyback:1000] Out: 1200 [Urine:1200] Intake/Output this shift: No intake/output data recorded.  General appearance: alert, cooperative, no distress and appears more comfortable than yesterday Resp: clear to auscultation bilaterally GI: Rare BS, firm, incisional tenderness Incision/Wound: No erythema or drainage  Lab Results:   Recent Labs  02/11/16 0408 02/12/16 0439  WBC 16.4* 11.9*  HGB 13.6 10.6*  HCT 39.8 32.0*  PLT 307 227   BMET  Recent Labs  02/11/16 0408 02/12/16 0439  NA 137 137  K 4.6 3.8  CL 104 105  CO2 27 27  GLUCOSE 190* 102*  BUN 10 11  CREATININE 1.03 0.88  CALCIUM 8.4* 7.6*     Studies/Results: No results found.  Anti-infectives: Anti-infectives    Start     Dose/Rate Route Frequency Ordered Stop   02/10/16 1035  cefoTEtan (CEFOTAN) 2 g in dextrose 5 % 50 mL IVPB     2 g 100 mL/hr over 30 Minutes Intravenous On call to O.R. 02/10/16 1035 02/10/16 1830      Assessment/Plan: s/p Procedure(s): LAPAROSCOPIC LEFT HEMI COLECTOMY CONVERTED TO OPEN SUBTOTAL COLECTOMY Overall stable/improved.  Some tachycardia and HTN yesterday, better p fluid boluses and small dose of lopressor. Leukocytosis improved.  Cont NPO x ice for now.  Ambulation encouraged.    LOS: 2 days    Mario Ford  T 02/12/2016

## 2016-02-13 ENCOUNTER — Encounter (HOSPITAL_COMMUNITY): Payer: Self-pay | Admitting: Certified Registered"

## 2016-02-13 LAB — CBC
HEMATOCRIT: 30.4 % — AB (ref 39.0–52.0)
HEMOGLOBIN: 10.2 g/dL — AB (ref 13.0–17.0)
MCH: 29.1 pg (ref 26.0–34.0)
MCHC: 33.6 g/dL (ref 30.0–36.0)
MCV: 86.9 fL (ref 78.0–100.0)
Platelets: 228 10*3/uL (ref 150–400)
RBC: 3.5 MIL/uL — ABNORMAL LOW (ref 4.22–5.81)
RDW: 13.7 % (ref 11.5–15.5)
WBC: 11.6 10*3/uL — ABNORMAL HIGH (ref 4.0–10.5)

## 2016-02-13 MED ORDER — POLYVINYL ALCOHOL 1.4 % OP SOLN
1.0000 [drp] | Freq: Four times a day (QID) | OPHTHALMIC | Status: DC | PRN
  Administered 2016-02-13 – 2016-02-14 (×3): 1 [drp] via OPHTHALMIC
  Filled 2016-02-13: qty 15

## 2016-02-13 NOTE — Progress Notes (Signed)
3 Days Post-Op  Subjective: Still lower abd/incisional pain main problem, worse when moving but gradually getting better.  No nausea.  No flatus or BM yet  Objective: Vital signs in last 24 hours: Temp:  [97.8 F (36.6 C)-99.3 F (37.4 C)] 97.8 F (36.6 C) (11/02 0552) Pulse Rate:  [94-115] 104 (11/02 0552) Resp:  [17-20] 20 (11/02 0552) BP: (112-134)/(72-97) 132/89 (11/02 0552) SpO2:  [96 %-97 %] 97 % (11/02 0552) Last BM Date: 02/10/16  Intake/Output from previous day: 11/01 0701 - 11/02 0700 In: 3585 [P.O.:60; I.V.:3525] Out: 2775 [Urine:2775] Intake/Output this shift: No intake/output data recorded.  General appearance: alert, cooperative and no distress GI: Moderate lower abd tenderness, mainly incisional, + BS Incision/Wound: No erythema or drainage  Lab Results:   Recent Labs  02/12/16 0439 02/13/16 0419  WBC 11.9* 11.6*  HGB 10.6* 10.2*  HCT 32.0* 30.4*  PLT 227 228   BMET  Recent Labs  02/11/16 0408 02/12/16 0439  NA 137 137  K 4.6 3.8  CL 104 105  CO2 27 27  GLUCOSE 190* 102*  BUN 10 11  CREATININE 1.03 0.88  CALCIUM 8.4* 7.6*     Studies/Results: No results found.  Anti-infectives: Anti-infectives    Start     Dose/Rate Route Frequency Ordered Stop   02/10/16 1035  cefoTEtan (CEFOTAN) 2 g in dextrose 5 % 50 mL IVPB     2 g 100 mL/hr over 30 Minutes Intravenous On call to O.R. 02/10/16 1035 02/10/16 1830      Assessment/Plan: s/p Procedure(s): LAPAROSCOPIC LEFT HEMI COLECTOMY CONVERTED TO OPEN SUBTOTAL COLECTOMY Gradual improvement without apparent complication Start CL diet   LOS: 3 days    Mario Ford T 11/2/2017Patient ID: Mario CharonAdam Ford, male   DOB: [birth year], 35 y.o.   MRN: [id number]

## 2016-02-14 LAB — CBC
HEMATOCRIT: 28.7 % — AB (ref 39.0–52.0)
HEMOGLOBIN: 9.9 g/dL — AB (ref 13.0–17.0)
MCH: 29.4 pg (ref 26.0–34.0)
MCHC: 34.5 g/dL (ref 30.0–36.0)
MCV: 85.2 fL (ref 78.0–100.0)
Platelets: 262 10*3/uL (ref 150–400)
RBC: 3.37 MIL/uL — ABNORMAL LOW (ref 4.22–5.81)
RDW: 13.4 % (ref 11.5–15.5)
WBC: 8.7 10*3/uL (ref 4.0–10.5)

## 2016-02-15 ENCOUNTER — Encounter (HOSPITAL_COMMUNITY): Payer: Self-pay | Admitting: *Deleted

## 2016-02-15 NOTE — Progress Notes (Signed)
Patient ID: Mario Ford, male   DOB: 28-Oct-1980, 35 y.o.   MR[facility]: [id number] [facility]:   5 Days Post-Op  Subjective: Mental status is clear.  Didn't have a great night.  Had gas cramps Objective: Vital signs in last 24 hours: Temp:  [98.4 F (36.9 C)-98.7 F (37.1 C)] 98.4 F (36.9 C) (11/04 0645) Pulse Rate:  [84-89] 88 (11/04 0645) Resp:  [16-18] 18 (11/04 0645) BP: (132-148)/(82-89) 132/84 (11/04 0645) SpO2:  [99 %-100 %] 99 % (11/04 0645)  Intake/Output from previous day: 11/03 0701 - 11/04 0700 In: 3045 [P.O.:720; I.V.:2325] Out: 1150 [Urine:1150] Intake/Output this shift: No intake/output data recorded.  Physical Exam: Work of breathing is normal.  Incisions OK.  Having active bowel sounds and BMs.    Lab Results:  Results for orders placed or performed during the hospital encounter of 02/10/16 (from the past 48 hour(s))  CBC     Status: Abnormal   Collection Time: 02/14/16  4:30 AM  Result Value Ref Range   WBC 8.7 4.0 - 10.5 K/uL   RBC 3.37 (L) 4.22 - 5.81 MIL/uL   Hemoglobin 9.9 (L) 13.0 - 17.0 g/dL   HCT 44.028.7 (L) 10.239.0 - 72.552.0 %   MCV 85.2 78.0 - 100.0 fL   MCH 29.4 26.0 - 34.0 pg   MCHC 34.5 30.0 - 36.0 g/dL   RDW 36.613.4 44.011.5 - 34.715.5 %   Platelets 262 150 - 400 K/uL    Radiology/Results: No results found.  Anti-infectives: Anti-infectives    Start     Dose/Rate Route Frequency Ordered Stop   02/10/16 1035  cefoTEtan (CEFOTAN) 2 g in dextrose 5 % 50 mL IVPB     2 g 100 mL/hr over 30 Minutes Intravenous On call to O.R. 02/10/16 1035 02/10/16 1830      Assessment/Plan: Problem List: Patient Active Problem List   Diagnosis Date Noted  . Diverticulitis large intestine 02/10/2016  . Diverticulitis of colon 10/21/2014  . Internal hemorrhoids 08/03/2014  . GERD (gastroesophageal reflux disease) 07/04/2014    Cramps are bothering him mainly.  Will back off on diet and only drink what he wants.   5 Days Post-Op    LOS: 5 days    Matt B. Daphine DeutscherMartin, MD, [facility]  [facility] [phone] beeper [phone]  02/15/2016 11:14 AM

## 2016-02-15 NOTE — Progress Notes (Signed)
[facility] Progress Note:   4 Days Post-Op  Subjective: Mental status is clear.  Eager to advance diet Objective: Vital signs in last 24 hours: Temp:  [98.4 F (36.9 C)-98.7 F (37.1 C)] 98.4 F (36.9 C) (11/04 0645) Pulse Rate:  [84-89] 88 (11/04 0645) Resp:  [16-18] 18 (11/04 0645) BP: (132-148)/(82-89) 132/84 (11/04 0645) SpO2:  [99 %-100 %] 99 % (11/04 0645)  Intake/Output from previous day: 11/03 0701 - 11/04 0700 In: 3045 [P.O.:720; I.V.:2325] Out: 1150 [Urine:1150] Intake/Output this shift: No intake/output data recorded.  Physical Exam: Work of breathing is normal.  BS active.  Passing flatus.  Incisions ok and covered  Lab Results:  Results for orders placed or performed during the hospital encounter of 02/10/16 (from the past 48 hour(s))  CBC     Status: Abnormal   Collection Time: 02/14/16  4:30 AM  Result Value Ref Range   WBC 8.7 4.0 - 10.5 K/uL   RBC 3.37 (L) 4.22 - 5.81 MIL/uL   Hemoglobin 9.9 (L) 13.0 - 17.0 g/dL   HCT 96.028.7 (L) 45.439.0 - 09.852.0 %   MCV 85.2 78.0 - 100.0 fL   MCH 29.4 26.0 - 34.0 pg   MCHC 34.5 30.0 - 36.0 g/dL   RDW 11.913.4 14.711.5 - 82.915.5 %   Platelets 262 150 - 400 K/uL    Radiology/Results: No results found.  Anti-infectives: Anti-infectives    Start     Dose/Rate Route Frequency Ordered Stop   02/10/16 1035  cefoTEtan (CEFOTAN) 2 g in dextrose 5 % 50 mL IVPB     2 g 100 mL/hr over 30 Minutes Intravenous On call to O.R. 02/10/16 1035 02/10/16 1830      Assessment/Plan: Problem List: Patient Active Problem List   Diagnosis Date Noted  . Diverticulitis large intestine 02/10/2016  . Diverticulitis of colon 10/21/2014  . Internal hemorrhoids 08/03/2014  . GERD (gastroesophageal reflux disease) 07/04/2014    Advancing diet after colectomy 4 Days Post-Op     Matt B. Daphine DeutscherMartin, MD, [facility]  [facility] [phone] beeper [phone]  02/15/2016 11:16 AM

## 2016-02-16 ENCOUNTER — Inpatient Hospital Stay (HOSPITAL_COMMUNITY): Payer: Managed Care, Other (non HMO)

## 2016-02-16 LAB — CBC WITH DIFFERENTIAL/PLATELET
BASOS ABS: 0 10*3/uL (ref 0.0–0.1)
BASOS PCT: 0 %
EOS ABS: 0.2 10*3/uL (ref 0.0–0.7)
EOS PCT: 2 %
HCT: 34 % — ABNORMAL LOW (ref 39.0–52.0)
Hemoglobin: 11.7 g/dL — ABNORMAL LOW (ref 13.0–17.0)
Lymphocytes Relative: 12 %
Lymphs Abs: 1.5 10*3/uL (ref 0.7–4.0)
MCH: 28.8 pg (ref 26.0–34.0)
MCHC: 34.4 g/dL (ref 30.0–36.0)
MCV: 83.7 fL (ref 78.0–100.0)
Monocytes Absolute: 1 10*3/uL (ref 0.1–1.0)
Monocytes Relative: 8 %
Neutro Abs: 10.1 10*3/uL — ABNORMAL HIGH (ref 1.7–7.7)
Neutrophils Relative %: 78 %
PLATELETS: 360 10*3/uL (ref 150–400)
RBC: 4.06 MIL/uL — AB (ref 4.22–5.81)
RDW: 13.6 % (ref 11.5–15.5)
WBC: 12.8 10*3/uL — AB (ref 4.0–10.5)

## 2016-02-16 MED ORDER — CALCIUM CARBONATE ANTACID 500 MG PO CHEW
400.0000 mg | CHEWABLE_TABLET | Freq: Three times a day (TID) | ORAL | Status: DC | PRN
  Administered 2016-02-16: 400 mg via ORAL
  Filled 2016-02-16: qty 2

## 2016-02-16 NOTE — Progress Notes (Signed)
Pt returned to his room from Xray via wheelchair. Pt asked if he would like to sit up in chair and he refused . Stated he was too uncomfortable to sit up right now. Refused pain meds.

## 2016-02-16 NOTE — Progress Notes (Signed)
Dr. Daphine DeutscherMartin in to see pt and family.

## 2016-02-16 NOTE — Progress Notes (Signed)
Dr. Daphine DeutscherMartin aware via phone pt c/o increased pain in LLQ. Lt side slightly more distended than rt when pt standing. Guarded and tender with some redness noted. See new orders received.

## 2016-02-16 NOTE — Progress Notes (Signed)
Dr. Daphine DeutscherMartin aware via phone pt and wife anxious to know results of recent labs and abd series. Md to round on pt soon and instructed nurse to inform them bowels are sluggish on left side. Pt and wife informed and reassurance given. No changes to LLQ from earlier assessment.

## 2016-02-16 NOTE — Progress Notes (Signed)
Pt c/o heartburn and acid reflux.  Notified MD on call, obtained order for Tums

## 2016-02-16 NOTE — Progress Notes (Signed)
Patient ID: Mario Ford, male   DOB: [birth year], 35 y.o.   MRN: [id number] [facility]   6 Days Post-Op  Subjective: Mental status is clear.  Had a better night and regulated po intake better yesterday.   Objective: Vital signs in last 24 hours: Temp:  [98.2 F (36.8 C)-98.5 F (36.9 C)] 98.2 F (36.8 C) (11/05 0500) Pulse Rate:  [85] 85 (11/05 0500) Resp:  [16-18] 18 (11/05 0500) BP: (118-141)/(69-83) 118/69 (11/05 0500) SpO2:  [97 %-100 %] 97 % (11/05 0500)  Intake/Output from previous day: 11/04 0701 - 11/05 0700 In: 1800 [I.V.:1800] Out: 700 [Urine:700] Intake/Output this shift: No intake/output data recorded.  Physical Exam: Work of breathing is normal.  Active BS and passing flatus  Lab Results:  No results found for this or any previous visit (from the past 48 hour(s)).  Radiology/Results: No results found.  Anti-infectives: Anti-infectives    Start     Dose/Rate Route Frequency Ordered Stop   02/10/16 1035  cefoTEtan (CEFOTAN) 2 g in dextrose 5 % 50 mL IVPB     2 g 100 mL/hr over 30 Minutes Intravenous On call to O.R. 02/10/16 1035 02/10/16 1830      Assessment/Plan: Problem List: Patient Active Problem List   Diagnosis Date Noted  . Diverticulitis large intestine 02/10/2016  . Diverticulitis of colon 10/21/2014  . Internal hemorrhoids 08/03/2014  . GERD (gastroesophageal reflux disease) 07/04/2014    Still getting used to the change in his bowel activity after resection and has a bit of anxiety over this.   6 Days Post-Op    LOS: 6 days   Matt B. Daphine DeutscherMartin, MD, [facility]FACS  [facility] [phone] beeper [phone]  02/16/2016 9:24 AM

## 2016-02-17 LAB — CBC WITH DIFFERENTIAL/PLATELET
BASOS ABS: 0 10*3/uL (ref 0.0–0.1)
BASOS PCT: 0 %
Eosinophils Absolute: 0.2 10*3/uL (ref 0.0–0.7)
Eosinophils Relative: 2 %
HEMATOCRIT: 32.8 % — AB (ref 39.0–52.0)
HEMOGLOBIN: 11.4 g/dL — AB (ref 13.0–17.0)
Lymphocytes Relative: 11 %
Lymphs Abs: 1.7 10*3/uL (ref 0.7–4.0)
MCH: 28.8 pg (ref 26.0–34.0)
MCHC: 34.8 g/dL (ref 30.0–36.0)
MCV: 82.8 fL (ref 78.0–100.0)
MONOS PCT: 8 %
Monocytes Absolute: 1.3 10*3/uL — ABNORMAL HIGH (ref 0.1–1.0)
NEUTROS ABS: 12.4 10*3/uL — AB (ref 1.7–7.7)
NEUTROS PCT: 79 %
Platelets: 354 10*3/uL (ref 150–400)
RBC: 3.96 MIL/uL — AB (ref 4.22–5.81)
RDW: 13.6 % (ref 11.5–15.5)
WBC: 15.7 10*3/uL — ABNORMAL HIGH (ref 4.0–10.5)

## 2016-02-17 MED ORDER — PIPERACILLIN-TAZOBACTAM 3.375 G IVPB
3.3750 g | Freq: Three times a day (TID) | INTRAVENOUS | Status: DC
  Administered 2016-02-17 – 2016-02-19 (×7): 3.375 g via INTRAVENOUS
  Filled 2016-02-17 (×7): qty 50

## 2016-02-17 NOTE — Progress Notes (Signed)
7 Days Post-Op  Subjective: Gradually feeling better.  However notices some swelling and redness left flank.  Tol full liquids with some cramping and bloating but denies N/V.  Having small BMs and passing flatus.  Still some lower abd pain but gradually less each day  Objective: Vital signs in last 24 hours: Temp:  [98.4 F (36.9 C)-98.8 F (37.1 C)] 98.8 F (37.1 C) (11/06 0537) Pulse Rate:  [96-104] 96 (11/06 0537) Resp:  [16-19] 16 (11/06 0537) BP: (129-131)/(76-84) 129/76 (11/06 0537) SpO2:  [94 %-97 %] 94 % (11/06 0537) Last BM Date: 02/17/16  Intake/Output from previous day: 11/05 0701 - 11/06 0700 In: 2122.5 [I.V.:2122.5] Out: 375 [Urine:375] Intake/Output this shift: No intake/output data recorded.  General appearance: alert, cooperative and no distress GI: Mild swelling and moderate erythema left flank. Moderate lower abd tenderness improveng.  Non distended Incision/Wound: No erythema or drainage  Lab Results:   Recent Labs  02/16/16 1355 02/17/16 0407  WBC 12.8* 15.7*  HGB 11.7* 11.4*  HCT 34.0* 32.8*  PLT 360 354   BMET No results for input(s): NA, K, CL, CO2, GLUCOSE, BUN, CREATININE, CALCIUM in the last 72 hours.   Studies/Results: Dg Abd Acute W/chest  Result Date: 02/16/2016 CLINICAL DATA:  Six days post colonic resection. Laparoscopic partial colectomy performed 02/10/2016 EXAM: DG ABDOMEN ACUTE W/ 1V CHEST COMPARISON:  None available FINDINGS: Low lung volumes with minor streaky left basilar atelectasis. Normal heart size and vascularity. No focal pneumonia, collapse or consolidation. No effusion or pneumothorax. Trachea is midline. No free air. Mild gas distention of small bowel in the left lower quadrant and pelvis. Associated air-fluid levels on the upright exam. Relative paucity of colonic gas however there is air present in the rectum. Findings compatible with partial obstruction versus residual ileus. No acute osseous finding. IMPRESSION: Mild  small-bowel distention in the left abdomen and pelvis with associated air-fluid levels. Air noted in the rectum. Appearance compatible with residual partial obstruction versus ileus. No free air. Left basilar atelectasis Electronically Signed   By: Judie PetitM.  Shick M.D.   On: 02/16/2016 14:54    Anti-infectives: Anti-infectives    Start     Dose/Rate Route Frequency Ordered Stop   02/17/16 0745  piperacillin-tazobactam (ZOSYN) IVPB 3.375 g     3.375 g 12.5 mL/hr over 240 Minutes Intravenous Every 8 hours 02/17/16 0740     02/10/16 1035  cefoTEtan (CEFOTAN) 2 g in dextrose 5 % 50 mL IVPB     2 g 100 mL/hr over 30 Minutes Intravenous On call to O.R. 02/10/16 1035 02/10/16 1830      Assessment/Plan: s/p Procedure(s): LAPAROSCOPIC LEFT HEMI COLECTOMY CONVERTED TO OPEN SUBTOTAL COLECTOMY Appears to have a left flank cellulitis with elevated WBC.  Does not act like intra abdominal complication such as leak.  KUB prob ileus.  Will start on IV Zosyn and observe 24 hr.  Consider CT abd if not improving   LOS: 7 days    Javona Bergevin T 11/6/2017Patient ID: Modesto CharonAdam Song, male   DOB: [birth year], 35 y.o.   MRN: [id number]

## 2016-02-17 NOTE — Progress Notes (Signed)
Pharmacy IV to PO conversion  The patient is receiving diphenhydramine by the intravenous route.  Based on the following criteria approved by the Pharmacy and Therapeutics Committee and the Medical Executive Committee, diphenhydramine is being converted to the equivalent oral dose form.   Not prescribed to treat or prevent a severe allergic reaction   Not prescribed as premedication prior to receiving blood product, biologic medication, antimicrobial, or chemotherapy agent   The patient has tolerated at least one dose of an oral or enteral medication   The patient has no evidence of active gastrointestinal bleeding or impaired GI absorption (gastrectomy, short bowel, patient on TNA or NPO).   The patient is not undergoing procedural sedation  If you have any questions about this conversion, please contact the Pharmacy Department (ext [phone]).  Thank you.  Bernadene Personrew Tarence Searcy, PharmD Pager: [phone] 02/17/2016, 2:48 PM

## 2016-02-18 LAB — CBC
HEMATOCRIT: 32.7 % — AB (ref 39.0–52.0)
Hemoglobin: 11 g/dL — ABNORMAL LOW (ref 13.0–17.0)
MCH: 28.6 pg (ref 26.0–34.0)
MCHC: 33.6 g/dL (ref 30.0–36.0)
MCV: 84.9 fL (ref 78.0–100.0)
Platelets: 348 10*3/uL (ref 150–400)
RBC: 3.85 MIL/uL — AB (ref 4.22–5.81)
RDW: 14.1 % (ref 11.5–15.5)
WBC: 14.7 10*3/uL — AB (ref 4.0–10.5)

## 2016-02-18 NOTE — Progress Notes (Signed)
8 Days Post-Op  Subjective: Continues to feel better. Less discomfort left flank. Still some fullness and occasional gas cramps after oral intake. Having bowel movements and passed a lot of flatus. Ambulatory in the hall.  Objective: Vital signs in last 24 hours: Temp:  [98.8 F (37.1 C)-99 F (37.2 C)] 98.8 F (37.1 C) (11/07 0503) Pulse Rate:  [77-98] 82 (11/07 0503) Resp:  [16] 16 (11/07 0503) BP: (116-122)/(68-79) 116/79 (11/07 0503) SpO2:  [98 %-100 %] 100 % (11/07 0503) Last BM Date: 02/17/16  Intake/Output from previous day: 11/06 0701 - 11/07 0700 In: 2150 [I.V.:2000; IV Piggyback:150] Out: 1200 [Urine:1200] Intake/Output this shift: No intake/output data recorded.  General appearance: alert, cooperative and no distress GI: Nondistended. No significant tenderness. Marked improvement in the erythema left flank. Incision/Wound: Clean and dry  Lab Results:   Recent Labs  02/17/16 0407 02/18/16 0426  WBC 15.7* 14.7*  HGB 11.4* 11.0*  HCT 32.8* 32.7*  PLT 354 348   BMET No results for input(s): NA, K, CL, CO2, GLUCOSE, BUN, CREATININE, CALCIUM in the last 72 hours.   Studies/Results: Dg Abd Acute W/chest  Result Date: 02/16/2016 CLINICAL DATA:  Six days post colonic resection. Laparoscopic partial colectomy performed 02/10/2016 EXAM: DG ABDOMEN ACUTE W/ 1V CHEST COMPARISON:  None available FINDINGS: Low lung volumes with minor streaky left basilar atelectasis. Normal heart size and vascularity. No focal pneumonia, collapse or consolidation. No effusion or pneumothorax. Trachea is midline. No free air. Mild gas distention of small bowel in the left lower quadrant and pelvis. Associated air-fluid levels on the upright exam. Relative paucity of colonic gas however there is air present in the rectum. Findings compatible with partial obstruction versus residual ileus. No acute osseous finding. IMPRESSION: Mild small-bowel distention in the left abdomen and pelvis with  associated air-fluid levels. Air noted in the rectum. Appearance compatible with residual partial obstruction versus ileus. No free air. Left basilar atelectasis Electronically Signed   By: Judie PetitM.  Shick M.D.   On: 02/16/2016 14:54    Anti-infectives: Anti-infectives    Start     Dose/Rate Route Frequency Ordered Stop   02/17/16 0800  piperacillin-tazobactam (ZOSYN) IVPB 3.375 g     3.375 g 12.5 mL/hr over 240 Minutes Intravenous Every 8 hours 02/17/16 0740     02/10/16 1035  cefoTEtan (CEFOTAN) 2 g in dextrose 5 % 50 mL IVPB     2 g 100 mL/hr over 30 Minutes Intravenous On call to O.R. 02/10/16 1035 02/10/16 1830      Assessment/Plan: s/p Procedure(s): LAPAROSCOPIC LEFT HEMI COLECTOMY CONVERTED TO OPEN SUBTOTAL COLECTOMY Improving. Left flank cellulitis of uncertain etiology looked significantly better. GI function improving. Advance to soft diet. Continue IV antibiotics for now. Check CBC in the a.m. Getting close to discharge.   LOS: 8 days    Aleen Marston T 11/7/2017Patient ID: Mario CharonAdam Rosenzweig, male   DOB: [birth year], 35 y.o.   MRN: [id number]

## 2016-02-19 LAB — CBC
HEMATOCRIT: 30.7 % — AB (ref 39.0–52.0)
HEMOGLOBIN: 10.3 g/dL — AB (ref 13.0–17.0)
MCH: 28.6 pg (ref 26.0–34.0)
MCHC: 33.6 g/dL (ref 30.0–36.0)
MCV: 85.3 fL (ref 78.0–100.0)
Platelets: 351 10*3/uL (ref 150–400)
RBC: 3.6 MIL/uL — AB (ref 4.22–5.81)
RDW: 14.1 % (ref 11.5–15.5)
WBC: 10.5 10*3/uL (ref 4.0–10.5)

## 2016-02-19 MED ORDER — OXYCODONE-ACETAMINOPHEN 5-325 MG PO TABS: 1.0000 | ORAL_TABLET | ORAL | 0 refills | Status: DC | PRN

## 2016-02-19 MED ORDER — AMOXICILLIN-POT CLAVULANATE 875-125 MG PO TABS: 1.0000 | ORAL_TABLET | Freq: Two times a day (BID) | ORAL | 0 refills | Status: DC

## 2016-02-19 NOTE — Progress Notes (Signed)
Staples removed as per MD order without complication. PIV removed without complication. Site is clean, dry and intact. Dsicahrge paperwork, prescriptions, follow up appointments, and reasons to return to MD/ED reviewed with patient. Understanding confirmed with teach back method. Pt escorted off of unit via wheelchair to main lobby to car and care of wife.

## 2016-02-19 NOTE — Discharge Summary (Signed)
Patient ID: Mario Ford [id number] 35 y.o. 01-11-1981  02/10/2016  Discharge date and time: 02/19/2016   Admitting Physician: Glenna FellowsHOXWORTH,Tomia Enlow T  Discharge Physician: Glenna FellowsHOXWORTH,Torien Ramroop T  Admission Diagnoses: DIVERTICULITIS  Discharge Diagnoses: Same  Operations: Procedure(s): LAPAROSCOPIC LEFT HEMI COLECTOMY CONVERTED TO OPEN SUBTOTAL COLECTOMY  Admission Condition: fair  Discharged Condition: good  Indication for Admission: Patient is a 35 year old male with a history of recurrent multiple episodes of diverticulitis with colonoscopy and imaging showing pan diverticulosis and evidence of diverticulitis of the sigmoid and left colon. After extensive preoperative workup and discussion detailed elsewhere he is electively admitted for laparoscopic hemicolectomy. He had undergone a mechanical and antibiotic bowel prep at home.  Hospital Course: On the morning of admission the patient underwent initially attempted laparoscopic left hemicolectomy. There were findings of significant chronic diverticulitis. Patient had a very foreshortened mesentery and laparoscopically and after conversion to open procedure were unable to mobilize any portion of his transverse colon to bring down to the rectosigmoid for anastomosis. Therefore the procedure was converted to subtotal colectomy with anastomosis of his terminal ileum to rectosigmoid. Postoperatively he initially had quite a bit of abdominal pain and also some persistent tachycardia. This improved with medication and fluid resuscitation. Leukocytosis gradually resolved. Nausea gradually resolved and he was able to be started on a liquid diet after several days when he began passing gas. He continued to improve until about one week postoperatively he developed some left flank pain and erythema as well as elevated white count to almost 16,000. Source of this was not clear, possibly retroperitoneal or possibly from an injection site. The remainder of his  abdomen remained benign and wounds were all clean. He was started on IV Zosyn and over the next 3 days quickly improved with resolution of erythema and normalization of his white count. His diet was able to be advanced. Pathology showed pan diverticulitis throughout the specimen. At the time of discharge he is afebrile with normal vital signs. Abdomen is soft and nontender. Incisions are all clean without erythema or drainage. Most of the staples are removed. He is tolerating a soft diet and having loose manageable bowel movements.   Disposition: Home  Patient Instructions:    Medication List    STOP taking these medications   metroNIDAZOLE 500 MG tablet Commonly known as:  FLAGYL   neomycin 500 MG tablet Commonly known as:  MYCIFRADIN     TAKE these medications   acetaminophen 500 MG tablet Commonly known as:  TYLENOL Take 500-1,000 mg by mouth every 6 (six) hours as needed (for pain).   amoxicillin-clavulanate 875-125 MG tablet Commonly known as:  AUGMENTIN Take 1 tablet by mouth 2 (two) times daily. What changed:  additional instructions   aspirin-acetaminophen-caffeine 250-250-65 MG tablet Commonly known as:  EXCEDRIN MIGRAINE Take 1-2 tablets by mouth every 6 (six) hours as needed for headache (for pain.).   ibuprofen 200 MG tablet Commonly known as:  ADVIL,MOTRIN Take 600-800 mg by mouth every 6 (six) hours as needed (for pain.).   NYQUIL PO Take by mouth as needed.   omeprazole 40 MG capsule Commonly known as:  PRILOSEC Take 1 capsule (40 mg total) by mouth daily.   oxyCODONE-acetaminophen 5-325 MG tablet Commonly known as:  PERCOCET/ROXICET Take 1-2 tablets by mouth every 4 (four) hours as needed for moderate pain.       Activity: no heavy lifting for 4 weeks Diet: regular diet Wound Care: none needed  Follow-up:  With Dr. Johna SheriffHoxworth in 1 week.  Signed: Mariella SaaBenjamin T Emeree Mahler MD, FACS  02/19/2016, 7:52 AM

## 2016-02-19 NOTE — Progress Notes (Signed)
Patient ID: Mario Ford, male   DOB: Jun 28, 1980, 35 y.o.   MRN: [id number] 9 Days Post-Op  Subjective: Continues to feel better. Still some discomfort along the left flank but improving. Ambulatory. Tolerated soft diet and had 4 bowel movements yesterday. No nausea or vomiting.  Objective: Vital signs in last 24 hours: Temp:  [98 F (36.7 C)-98.9 F (37.2 C)] 98 F (36.7 C) (11/08 0603) Pulse Rate:  [75-104] 75 (11/08 0603) Resp:  [15-16] 16 (11/08 0603) BP: (102-127)/(62-76) 102/62 (11/08 0603) SpO2:  [97 %-99 %] 99 % (11/08 0603) Last BM Date: 02/17/16  Intake/Output from previous day: 11/07 0701 - 11/08 0700 In: 1973.3 [P.O.:360; I.V.:1513.3; IV Piggyback:100] Out: 500 [Urine:500] Intake/Output this shift: No intake/output data recorded.  General appearance: alert, cooperative and no distress GI: normal findings: soft, non-tender and Nondistended Incision/Wound: Clean and dry without evidence of infection  Lab Results:   Recent Labs  02/18/16 0426 02/19/16 0537  WBC 14.7* 10.5  HGB 11.0* 10.3*  HCT 32.7* 30.7*  PLT 348 351   BMET No results for input(s): NA, K, CL, CO2, GLUCOSE, BUN, CREATININE, CALCIUM in the last 72 hours.   Studies/Results: No results found.  Anti-infectives: Anti-infectives    Start     Dose/Rate Route Frequency Ordered Stop   02/17/16 0800  piperacillin-tazobactam (ZOSYN) IVPB 3.375 g     3.375 g 12.5 mL/hr over 240 Minutes Intravenous Every 8 hours 02/17/16 0740     02/10/16 1035  cefoTEtan (CEFOTAN) 2 g in dextrose 5 % 50 mL IVPB     2 g 100 mL/hr over 30 Minutes Intravenous On call to O.R. 02/10/16 1035 02/10/16 1830      Assessment/Plan: s/p Procedure(s): LAPAROSCOPIC LEFT HEMI COLECTOMY CONVERTED TO OPEN SUBTOTAL COLECTOMY Doing well. Left flank cellulitis of uncertain etiology appears to be clinically resolving. Okay for discharge. Plan follow-up with 5 days of oral Augmentin at home. Most staples will be removed prior to  discharge.   LOS: 9 days    Shalaya Swailes T 02/19/2016

## 2016-02-19 NOTE — Progress Notes (Signed)
MD in to see patient and family.

## 2016-02-19 NOTE — Discharge Instructions (Signed)
[facility]      [facility] [phone]  OPEN ABDOMINAL SURGERY: POST OP INSTRUCTIONS  Always review your discharge instruction sheet given to you by the facility where your surgery was performed.  IF YOU HAVE DISABILITY OR FAMILY LEAVE FORMS, YOU MUST BRING THEM TO THE OFFICE FOR PROCESSING.  PLEASE DO NOT GIVE THEM TO YOUR DOCTOR.  1. A prescription for pain medication may be given to you upon discharge.  Take your pain medication as prescribed, if needed.  If narcotic pain medicine is not needed, then you may take acetaminophen (Tylenol) or ibuprofen (Advil) as needed. 2. Take your usually prescribed medications unless otherwise directed. 3. If you need a refill on your pain medication, please contact your pharmacy. They will contact our office to request authorization.  Prescriptions will not be filled after 5pm or on week-ends. 4. You should follow a light diet the first few days after arrival home, such as soup and crackers, pudding, etc.unless your doctor has advised otherwise. A high-fiber, low fat diet can be resumed as tolerated.   Be sure to include lots of fluids daily. Most patients will experience some swelling and bruising on the chest and neck area.  Ice packs will help.  Swelling and bruising can take several days to resolve 5. Most patients will experience some swelling and bruising in the area of the incision. Ice pack will help. Swelling and bruising can take several days to resolve..  6. It is common to experience some constipation if taking pain medication after surgery.  Increasing fluid intake and taking a stool softener will usually help or prevent this problem from occurring.  A mild laxative (Milk of Magnesia or Miralax) should be taken according to package directions if there are no bowel movements after 48 hours. 7.  You may have steri-strips (small skin tapes) in place directly over the incision.  These strips should be left on the skin for 7-10 days.  If your  surgeon used skin glue on the incision, you may shower in 24 hours.  The glue will flake off over the next 2-3 weeks.  Any sutures or staples will be removed at the office during your follow-up visit. You may find that a light gauze bandage over your incision may keep your staples from being rubbed or pulled. You may shower and replace the bandage daily. 8. ACTIVITIES:  You may resume regular (light) daily activities beginning the next day--such as daily self-care, walking, climbing stairs--gradually increasing activities as tolerated.  You may have sexual intercourse when it is comfortable.  Refrain from any heavy lifting or straining until approved by your doctor. a. You may drive when you no longer are taking prescription pain medication, you can comfortably wear a seatbelt, and you can safely maneuver your car and apply brakes b. Return to Work: ___________________________________ 9. You should see your doctor in the office for a follow-up appointment approximately two weeks after your surgery.  Make sure that you call for this appointment within a day or two after you arrive home to insure a convenient appointment time. OTHER INSTRUCTIONS:  _____________________________________________________________ _____________________________________________________________  WHEN TO CALL YOUR DOCTOR: 1. Fever over 101.0 2. Inability to urinate 3. Nausea and/or vomiting 4. Extreme swelling or bruising 5. Continued bleeding from incision. 6. Increased pain, redness, or drainage from the incision. 7. Difficulty swallowing or breathing 8. Muscle cramping or spasms. 9. Numbness or tingling in hands or feet or around lips.  The clinic staff is available to   answer your questions during regular business hours.  Please don't hesitate to call and ask to speak to one of the nurses if you have concerns.  For further questions, please visit [email or web address]   

## 2016-03-16 ENCOUNTER — Other Ambulatory Visit: Payer: Self-pay | Admitting: General Surgery

## 2016-03-23 ENCOUNTER — Other Ambulatory Visit: Payer: Self-pay | Admitting: General Surgery

## 2016-03-23 ENCOUNTER — Ambulatory Visit
Admission: RE | Admit: 2016-03-23 | Discharge: 2016-03-23 | Disposition: A | Discharge status: Home / Self Care | Payer: Managed Care, Other (non HMO) | Source: Ambulatory Visit | Attending: General Surgery | Admitting: General Surgery

## 2016-03-23 DIAGNOSIS — R110 Nausea: Secondary | ICD-10-CM

## 2016-03-31 ENCOUNTER — Other Ambulatory Visit: Payer: Self-pay | Admitting: General Surgery

## 2016-03-31 DIAGNOSIS — R110 Nausea: Secondary | ICD-10-CM

## 2016-04-02 ENCOUNTER — Ambulatory Visit
Admission: RE | Admit: 2016-04-02 | Discharge: 2016-04-02 | Disposition: A | Discharge status: Home / Self Care | Payer: Managed Care, Other (non HMO) | Source: Ambulatory Visit | Attending: General Surgery | Admitting: General Surgery

## 2016-04-02 DIAGNOSIS — R110 Nausea: Secondary | ICD-10-CM

## 2016-04-02 MED ORDER — IOPAMIDOL (ISOVUE-300) INJECTION 61%
100.0000 mL | Freq: Once | INTRAVENOUS | Status: AC | PRN
  Administered 2016-04-02: 100 mL via INTRAVENOUS

## 2016-04-03 ENCOUNTER — Telehealth: Payer: Self-pay

## 2016-04-03 NOTE — Telephone Encounter (Signed)
Pt scheduled to see Willette ClusterPaula Guenther NP 04/15/16@3on , pt aware of appt. Pt also wanted an appt with Dr. [name]. Pt /scheduled to see Dr. [name] 05/20/16@10 :45am.

## 2016-04-03 NOTE — Telephone Encounter (Signed)
-----   Message from Hilarie FredricksonJohn N Perry, MD sent at 04/03/2016  2:22 PM EST ----- Regarding: Office appointment for nausea Bonita QuinLinda, Dr. Johna SheriffHoxworth called me about this patient who is almost 2 months postop (subtotal colectomy for diverticular disease). He is apparently having problems with significant nausea without vomiting. Please set him up to see one of our extenders regarding this complaint. Convert this to phone note for the record. Thanks Dr. [name]

## 2016-04-15 ENCOUNTER — Other Ambulatory Visit (INDEPENDENT_AMBULATORY_CARE_PROVIDER_SITE_OTHER): Payer: Managed Care, Other (non HMO)

## 2016-04-15 ENCOUNTER — Ambulatory Visit (INDEPENDENT_AMBULATORY_CARE_PROVIDER_SITE_OTHER): Payer: Managed Care, Other (non HMO) | Admitting: Nurse Practitioner

## 2016-04-15 ENCOUNTER — Other Ambulatory Visit: Payer: Self-pay | Admitting: *Deleted

## 2016-04-15 ENCOUNTER — Encounter: Payer: Self-pay | Admitting: Nurse Practitioner

## 2016-04-15 VITALS — BP 120/78 | HR 104 | Ht 65.5 in | Wt 182.4 lb

## 2016-04-15 DIAGNOSIS — R197 Diarrhea, unspecified: Secondary | ICD-10-CM | POA: Diagnosis not present

## 2016-04-15 DIAGNOSIS — R110 Nausea: Secondary | ICD-10-CM

## 2016-04-15 LAB — CBC WITH DIFFERENTIAL/PLATELET
BASOS ABS: 0.1 10*3/uL (ref 0.0–0.1)
Basophils Relative: 0.4 % (ref 0.0–3.0)
Eosinophils Absolute: 0.1 10*3/uL (ref 0.0–0.7)
Eosinophils Relative: 0.4 % (ref 0.0–5.0)
HCT: 42.2 % (ref 39.0–52.0)
Hemoglobin: 14.5 g/dL (ref 13.0–17.0)
LYMPHS ABS: 1.3 10*3/uL (ref 0.7–4.0)
Lymphocytes Relative: 10.2 % — ABNORMAL LOW (ref 12.0–46.0)
MCHC: 34.3 g/dL (ref 30.0–36.0)
MCV: 82.7 fl (ref 78.0–100.0)
MONOS PCT: 6.6 % (ref 3.0–12.0)
Monocytes Absolute: 0.8 10*3/uL (ref 0.1–1.0)
NEUTROS ABS: 10.4 10*3/uL — AB (ref 1.4–7.7)
NEUTROS PCT: 82.4 % — AB (ref 43.0–77.0)
PLATELETS: 290 10*3/uL (ref 150.0–400.0)
RBC: 5.1 Mil/uL (ref 4.22–5.81)
RDW: 13.3 % (ref 11.5–15.5)
WBC: 12.6 10*3/uL — ABNORMAL HIGH (ref 4.0–10.5)

## 2016-04-15 MED ORDER — DIPHENOXYLATE-ATROPINE 2.5-0.025 MG PO TABS: 1.0000 | ORAL_TABLET | Freq: Two times a day (BID) | ORAL | 1 refills | Status: DC

## 2016-04-15 NOTE — Progress Notes (Signed)
     HPI: Patient is a 36 year old male known to Dr. [name]. He has a history of recurrent diverticulitis and is status post subtotal colectomy by Dr. Johna SheriffHoxworth early November (had to be converted to open procedure due to significant chronic diverticulitis precluding mobilization of the transverse colon) .  Postoperatively patient struggled with nausea and at one point developed left flank pain /erythema and white count of 16,000. Source of infection not determined , ? Soft tissue infection. With exception of nausea,  symptoms did eventually resolve. Several days after hospital discharge patient develop recurrent nausea which he continues to struggle with. He is also trying to manage multiple loose bowel movements a day following subtotal colectomy. The nausea he tried Zofran but it increased the diarrhea. Phenergan is too sedating. Compazine wasn't really effective. For the diarrhea, she tried Imodium without much improvement. He takes paregoric sometimes but the taste is almost unbearable. He has 7-8 loose stools a day, many of which are nocturnal.   Nausea and is mainly postprandial. Patient is eating very small quantities of food. He basically snacks throughout the day until dinner. He consumes the most at dinnertime so this is when his nausea is most pronounced. No vomiting.  Last night he had chills and temp of 100.7. No coughing or urinary symptoms. No narcotics for pain. No NSAIDs  Past Medical History:  Diagnosis Date  . Diverticulitis   . Esophageal reflux   . Fatty liver   . Functional dyspepsia   . History of hiatal hernia   . Obesity   . Vitamin D deficiency    Patient's surgical history, family medical history, social history, medications and allergies were all reviewed in Epic    Physical Exam: BP 120/78   Pulse (!) 104   Ht 5' 5.5" (1.664 m)   Wt 182 lb 6.4 oz (82.7 kg)   BMI 29.89 kg/m   GENERAL: pleasant white male in NAD. Here with wife PSYCH: :Pleasant, cooperative,  normal affect HEENT: Normocephalic, conjunctiva pink, mucous membranes moist, neck supple without masses CARDIAC:  RRR, no murmur heard, no peripheral edema PULM: Normal respiratory effort, lungs CTA bilaterally, no wheezing ABDOMEN:  soft, nontender, nondistended, no obvious masses, no hepatomegaly,  normal bowel sounds SKIN:  turgor, no lesions seen Musculoskeletal:  No muscle tone, normal  strength NEURO: Alert and oriented x 3, no focal neurologic deficits  ASSESSMENT and PLAN:  1. 36 yo male with recurrent diverticulitis, s/p subtotal colectomy early November. Follow up CTscan on 12/21 revealed post-op changes with minimal loculated fluid in mid abdomen and mesenteric thickening c/w recent subtotal colectomy. Still having 7-8 loose stools a day, many of which are nocturnal. Imodium ineffective. Takes Paregoric as needed but taste is awful and doesn't like taking opioid based medications which is good because it can cause worsening nausea.  Of note, C-diff checked early December and negative.  --He had a subtotal colectomy, if will take time. Will try Lomotil.  2. Persistent post-op nausea. No obstructive processes on CTscan 12/21.  Zofran increases diarrhea, compazine didn't help and phenergan too sedating. -For prolonged unexplained nausea he will be scheduled for an upper endoscopy. The risks and benefits of EGD were discussed and the patient agrees to proceed.     Willette ClusterPaula Guenther , NP 04/15/2016, 3:35 PM    Cc. Glenna FellowsBenjamin Hoxworth, MD

## 2016-04-15 NOTE — Patient Instructions (Addendum)
   You have been scheduled for an endoscopy. Please follow written instructions given to you at your visit today. If you use inhalers (even only as needed), please bring them with you on the day of your procedure.    Your physician has requested that you go to the basement for the following lab work before leaving today: CBC/diff, C-diff stool test   We have sent the following medications to your pharmacy for you to pick up at your convenience: Lomotil, faxed in to [facility] in [place]   I appreciate the opportunity to care for you.

## 2016-04-16 ENCOUNTER — Encounter: Payer: Self-pay | Admitting: Internal Medicine

## 2016-04-16 ENCOUNTER — Ambulatory Visit (AMBULATORY_SURGERY_CENTER): Payer: Managed Care, Other (non HMO) | Admitting: Internal Medicine

## 2016-04-16 VITALS — BP 113/68 | HR 84 | Temp 96.4°F | Resp 16 | Ht 65.5 in | Wt 182.0 lb

## 2016-04-16 DIAGNOSIS — R110 Nausea: Secondary | ICD-10-CM | POA: Diagnosis present

## 2016-04-16 MED ORDER — ONDANSETRON HCL 4 MG PO TABS: 4.0000 mg | ORAL_TABLET | Freq: Three times a day (TID) | ORAL | 2 refills | Status: DC | PRN

## 2016-04-16 MED ORDER — SODIUM CHLORIDE 0.9 % IV SOLN: 500.0000 mL | INTRAVENOUS | Status: AC

## 2016-04-16 NOTE — Progress Notes (Signed)
.   Reviewed and agree with plans as outlined

## 2016-04-16 NOTE — Progress Notes (Signed)
Report to PACU, RN, vss, BBS= Clear.  

## 2016-04-16 NOTE — Op Note (Signed)
[facility] Patient Name: Mario Ford Procedure Date: 04/16/2016 2:07 PM MRN: [id number] Endoscopist: Wilhemina Bonito. Mario Ford , MD Age: 36 Referring MD:  Date of Birth: 01/26/1981 Gender: Male Account #: [id number] Procedure:                Upper GI endoscopy Indications:              Nausea, Persistent of unknown cause Medicines:                Monitored Anesthesia Care Procedure:                Pre-Anesthesia Assessment:                           - Prior to the procedure, a History and Physical                            was performed, and patient medications and                            allergies were reviewed. The patient's tolerance of                            previous anesthesia was also reviewed. The risks                            and benefits of the procedure and the sedation                            options and risks were discussed with the patient.                            All questions were answered, and informed consent                            was obtained. Prior Anticoagulants: The patient has                            taken no previous anticoagulant or antiplatelet                            agents. ASA Grade Assessment: I - A normal, healthy                            patient. After reviewing the risks and benefits,                            the patient was deemed in satisfactory condition to                            undergo the procedure.                           After obtaining informed consent, the endoscope was  passed under direct vision. Throughout the                            procedure, the patient's blood pressure, pulse, and                            oxygen saturations were monitored continuously. The                            Model GIF-HQ190 [id number]) scope was introduced                            through the mouth, and advanced to the second part                            of duodenum. The upper GI endoscopy  was                            accomplished without difficulty. The patient                            tolerated the procedure well. Scope In: Scope Out: Findings:                 The esophagus was normal.                           The stomach was normal.                           The examined duodenum was normal.                           The cardia and gastric fundus were normal on                            retroflexion. Complications:            No immediate complications. Estimated Blood Loss:     Estimated blood loss: none. Impression:               - Normal EGD. Recommendation:           1. Takes Zofran 3 times daily before meals.                            Prescribe Zofran 4 mg; #90; 2 refills                           2. Take recently prescribed Lomotil one 3 times                            daily regularly. If after 1 week diarrhea has not                            significantly improved, may increase to two 3 times  daily.                           3. Keep follow-up appointment with Dr. [name] next                            month as scheduled Wilhemina BonitoJohn N. Mario GoodellPerry, MD 04/16/2016 2:20:21 PM This report has been signed electronically.

## 2016-04-16 NOTE — Patient Instructions (Signed)
YOU HAD AN ENDOSCOPIC PROCEDURE TODAY AT THE [facility]:   Refer to the procedure report that was given to you for any specific questions about what was found during the examination.  If the procedure report does not answer your questions, please call your gastroenterologist to clarify.  If you requested that your care partner not be given the details of your procedure findings, then the procedure report has been included in a sealed envelope for you to review at your convenience later.  YOU SHOULD EXPECT: Some feelings of bloating in the abdomen. Passage of more gas than usual.  Walking can help get rid of the air that was put into your GI tract during the procedure and reduce the bloating. If you had a lower endoscopy (such as a colonoscopy or flexible sigmoidoscopy) you may notice spotting of blood in your stool or on the toilet paper. If you underwent a bowel prep for your procedure, you may not have a normal bowel movement for a few days.  Please Note:  You might notice some irritation and congestion in your nose or some drainage.  This is from the oxygen used during your procedure.  There is no need for concern and it should clear up in a day or so.  SYMPTOMS TO REPORT IMMEDIATELY:   Following upper endoscopy (EGD)  Vomiting of blood or coffee ground material  New chest pain or pain under the shoulder blades  Painful or persistently difficult swallowing  New shortness of breath  Fever of 100F or higher  Black, tarry-looking stools  For urgent or emergent issues, a gastroenterologist can be reached at any hour by calling [phone].   DIET:  We do recommend a small meal at first, but then you may proceed to your regular diet.  Drink plenty of fluids but you should avoid alcoholic beverages for 24 hours.  ACTIVITY:  You should plan to take it easy for the rest of today and you should NOT DRIVE or use heavy machinery until tomorrow (because of the sedation medicines used  during the test).    FOLLOW UP: Our staff will call the number listed on your records the next business day following your procedure to check on you and address any questions or concerns that you may have regarding the information given to you following your procedure. If we do not reach you, we will leave a message.  However, if you are feeling well and you are not experiencing any problems, there is no need to return our call.  We will assume that you have returned to your regular daily activities without incident.  If any biopsies were taken you will be contacted by phone or by letter within the next 1-3 weeks.  Please call us at [phone] if you have not heard about the biopsies in 3 weeks.    SIGNATURES/CONFIDENTIALITY: You and/or your care partner have signed paperwork which will be entered into your electronic medical record.  These signatures attest to the fact that that the information above on your After Visit Summary has been reviewed and is understood.  Full responsibility of the confidentiality of this discharge information lies with you and/or your care-partner. 

## 2016-04-17 ENCOUNTER — Other Ambulatory Visit: Payer: Managed Care, Other (non HMO)

## 2016-04-17 ENCOUNTER — Other Ambulatory Visit: Payer: Self-pay

## 2016-04-17 ENCOUNTER — Telehealth: Payer: Self-pay

## 2016-04-17 DIAGNOSIS — R197 Diarrhea, unspecified: Secondary | ICD-10-CM

## 2016-04-17 LAB — CLOSTRIDIUM DIFFICILE BY PCR: Toxigenic C. Difficile by PCR: NOT DETECTED

## 2016-04-17 MED ORDER — METRONIDAZOLE 250 MG PO TABS
250.0000 mg | ORAL_TABLET | Freq: Four times a day (QID) | ORAL | 0 refills | Status: DC
Start: 2016-04-17 — End: 2016-05-20

## 2016-04-17 NOTE — Telephone Encounter (Signed)
Mario Ford, he was having fevers preprocedure. He has not submitted stool study for Clostridium difficile yet. He needs to. Empirically start metronidazole 250 mg 4 times daily 1 week. Thanks

## 2016-04-17 NOTE — Telephone Encounter (Signed)
Pt aware and knows to bring stool specimen asap and if worsens to go to the ER.

## 2016-04-17 NOTE — Telephone Encounter (Signed)
Given that, submit stool for Clostridium difficile ASAP. We will wait on that result,s he looked well yesterday. He should continue to monitor his temperature. If he develops high fever or looks ill (which he did not yesterday) he should go to the ER, may need repeat CT scan of the abdomen and pelvis (but hold off for now)

## 2016-04-17 NOTE — Telephone Encounter (Signed)
  Follow up Call-  Call back number 04/16/2016 12/12/2014  Post procedure Call Back phone  # [phone]  Permission to leave phone message Yes Yes     Patient was called for follow up after his procedure on 04/16/2016. I spoke with the patients wife and she reports that Mario Ford had a fever of 101.00 last night. She states that it was the second night that he has had a temperature of 101. The patients wife states that Mario Ford has tried taking Lomotil 3 times a day and he continues to have urgent, watering diarrhea. He was up several times through the night with diarrhea. Please advise. Thanks.   Mario DaneNancy Dangelo Guzzetta, LPN

## 2016-04-17 NOTE — Telephone Encounter (Signed)
Spoke with pts wife and she states pt cannot take flagyl. Reports he has taken it in the past and it makes him sick on his stomach. Please advise.

## 2016-05-01 ENCOUNTER — Encounter (HOSPITAL_COMMUNITY): Payer: Self-pay

## 2016-05-20 ENCOUNTER — Encounter: Payer: Self-pay | Admitting: Internal Medicine

## 2016-05-20 ENCOUNTER — Ambulatory Visit (INDEPENDENT_AMBULATORY_CARE_PROVIDER_SITE_OTHER): Payer: Managed Care, Other (non HMO) | Admitting: Internal Medicine

## 2016-05-20 VITALS — BP 124/82 | HR 80 | Ht 65.5 in | Wt 185.4 lb

## 2016-05-20 DIAGNOSIS — R110 Nausea: Secondary | ICD-10-CM | POA: Diagnosis not present

## 2016-05-20 DIAGNOSIS — R197 Diarrhea, unspecified: Secondary | ICD-10-CM | POA: Diagnosis not present

## 2016-05-20 DIAGNOSIS — K219 Gastro-esophageal reflux disease without esophagitis: Secondary | ICD-10-CM

## 2016-05-20 MED ORDER — OMEPRAZOLE 40 MG PO CPDR: 40.0000 mg | DELAYED_RELEASE_CAPSULE | Freq: Every day | ORAL | 3 refills | Status: DC

## 2016-05-20 MED ORDER — ONDANSETRON HCL 4 MG PO TABS: 4.0000 mg | ORAL_TABLET | Freq: Three times a day (TID) | ORAL | 2 refills | Status: AC | PRN

## 2016-05-20 MED ORDER — AMOXICILLIN-POT CLAVULANATE 875-125 MG PO TABS: 1.0000 | ORAL_TABLET | Freq: Two times a day (BID) | ORAL | 0 refills | Status: DC

## 2016-05-20 MED ORDER — DIPHENOXYLATE-ATROPINE 2.5-0.025 MG PO TABS: 1.0000 | ORAL_TABLET | Freq: Two times a day (BID) | ORAL | 3 refills | Status: DC

## 2016-05-20 NOTE — Patient Instructions (Signed)
We have sent the following medications to your pharmacy for you to pick up at your convenience:  Prilosec, Lomotil, Zofran, Augmentin  Please call back in 2-3 weeks to let us know how you are doing.

## 2016-05-20 NOTE — Progress Notes (Signed)
HISTORY OF PRESENT ILLNESS:  Mario Ford is a 36 y.o. male with GERD and recurrent diverticulitis for which he underwent subtotal colectomy with Dr. Johna SheriffHoxworth early November 2017. He was evaluated in the office 04/15/2016 for problems with postoperative nausea and diarrhea. He subsequently underwent upper endoscopy 04/16/2016. This was normal. It was recommended that he continue PPI, take Zofran 3 times daily before meals and Lomotil 1-2 three times daily on a scheduled basis. Follow-up at this time planned. He is pleased report resolution of his nausea. Now taking antinausea medicine once in the evening. No vomiting. In terms of diarrhea, he has made only modest improvement. Slightly less frequent, slightly more formed, slightly less urgent. Still problematic. He describes bowel movements anywhere from 4-10 times daily rarely at night. No new complaints or issues. Testing for Clostridium difficile negative. No weight loss.  REVIEW OF SYSTEMS:  All non-GI ROS negative upon review  Past Medical History:  Diagnosis Date  . Anal itch   . Anxiety   . Diverticulitis   . Esophageal reflux   . Fatty liver   . Functional dyspepsia   . Health education   . History of hiatal hernia   . Obesity   . Vitamin D deficiency     Past Surgical History:  Procedure Laterality Date  . acl-left knee Left    x 2  . APPENDECTOMY    . LAPAROSCOPIC PARTIAL COLECTOMY N/A 02/10/2016   Procedure: LAPAROSCOPIC LEFT HEMI COLECTOMY CONVERTED TO OPEN SUBTOTAL COLECTOMY;  Surgeon: Glenna FellowsBenjamin Hoxworth, MD;  Location: [facility];  Service: General;  Laterality: N/A;  . TONSILLECTOMY      Social History Mario Charondam Ruan  reports that he has never smoked. He has never used smokeless tobacco. He reports that he drinks alcohol. He reports that he does not use drugs.  family history includes Diabetes Mellitus II in his maternal grandfather and paternal grandfather; Melanoma in his father.  Allergies  Allergen Reactions  . Sulfa  Antibiotics Hives and Other (See Comments)    Childhood reaction       PHYSICAL EXAMINATION: Vital signs: BP 124/82 (BP Location: Left Arm, Patient Position: Sitting, Cuff Size: Normal)   Pulse 80   Ht 5' 5.5" (1.664 m)   Wt 185 lb 6 oz (84.1 kg)   BMI 30.38 kg/m   Constitutional: generally well-appearing, no acute distress Psychiatric: alert and oriented x3, cooperative Abdomen: Not reexamined Rectal:Omitted Skin: no lesions on visible extremities Neuro: No focal deficits.   ASSESSMENT:  #1. History of recurrent diverticulitis status post subtotal colectomy #2. Problems with postoperative nausea. Improved #3. Problems with postoperative diarrhea, ongoing   PLAN:  #1. Continue PPI. Refilled today #2. Continue Zofran as needed. Refilled today #3. Continue Lomotil. Refilled today #4. Prescribed Augmentin 875 mg twice daily for 10 days for possible bacterial overgrowth #5. I asked patient to contact the office in 2-3 weeks for follow-up. He is still having issues at that time will consider Colestid 2 g twice daily.   25 minutes spent face-to-face with the patient. Greater 50% a time use for counseling regarding his problems with nausea and diarrhea, and the plan as outlined

## 2016-09-08 ENCOUNTER — Other Ambulatory Visit (INDEPENDENT_AMBULATORY_CARE_PROVIDER_SITE_OTHER): Payer: 59

## 2016-09-08 ENCOUNTER — Telehealth: Payer: Self-pay | Admitting: Internal Medicine

## 2016-09-08 ENCOUNTER — Ambulatory Visit (INDEPENDENT_AMBULATORY_CARE_PROVIDER_SITE_OTHER): Payer: 59 | Admitting: Physician Assistant

## 2016-09-08 ENCOUNTER — Encounter: Payer: Self-pay | Admitting: Physician Assistant

## 2016-09-08 VITALS — BP 110/80 | HR 96 | Ht 65.5 in | Wt 193.0 lb

## 2016-09-08 DIAGNOSIS — R109 Unspecified abdominal pain: Secondary | ICD-10-CM

## 2016-09-08 DIAGNOSIS — R197 Diarrhea, unspecified: Secondary | ICD-10-CM

## 2016-09-08 LAB — CBC WITH DIFFERENTIAL/PLATELET
BASOS PCT: 0.7 % (ref 0.0–3.0)
Basophils Absolute: 0 10*3/uL (ref 0.0–0.1)
EOS ABS: 0.1 10*3/uL (ref 0.0–0.7)
Eosinophils Relative: 1.3 % (ref 0.0–5.0)
HEMATOCRIT: 46.3 % (ref 39.0–52.0)
Hemoglobin: 15.8 g/dL (ref 13.0–17.0)
LYMPHS PCT: 26.1 % (ref 12.0–46.0)
Lymphs Abs: 1.8 10*3/uL (ref 0.7–4.0)
MCHC: 34 g/dL (ref 30.0–36.0)
MCV: 84.8 fl (ref 78.0–100.0)
Monocytes Absolute: 0.7 10*3/uL (ref 0.1–1.0)
Monocytes Relative: 9.7 % (ref 3.0–12.0)
NEUTROS ABS: 4.3 10*3/uL (ref 1.4–7.7)
Neutrophils Relative %: 62.2 % (ref 43.0–77.0)
PLATELETS: 313 10*3/uL (ref 150.0–400.0)
RBC: 5.46 Mil/uL (ref 4.22–5.81)
RDW: 13.3 % (ref 11.5–15.5)
WBC: 6.9 10*3/uL (ref 4.0–10.5)

## 2016-09-08 LAB — BASIC METABOLIC PANEL
BUN: 15 mg/dL (ref 6–23)
CALCIUM: 9.8 mg/dL (ref 8.4–10.5)
CO2: 30 mEq/L (ref 19–32)
CREATININE: 1.05 mg/dL (ref 0.40–1.50)
Chloride: 99 mEq/L (ref 96–112)
GFR: 83.9 mL/min (ref >60.00)
Glucose, Bld: 100 mg/dL — ABNORMAL HIGH (ref 70–99)
Potassium: 4.4 mEq/L (ref 3.5–5.1)
Sodium: 137 mEq/L (ref 135–145)

## 2016-09-08 LAB — SEDIMENTATION RATE: SED RATE: 15 mm/h (ref 0–15)

## 2016-09-08 MED ORDER — DIPHENOXYLATE-ATROPINE 2.5-0.025 MG PO TABS: ORAL_TABLET | ORAL | 2 refills | Status: DC

## 2016-09-08 NOTE — Progress Notes (Signed)
Assessment and plans reviewed  

## 2016-09-08 NOTE — Progress Notes (Signed)
Subjective:    Patient ID: Mario Ford, male    DOB: 1981/01/06, [age] y.o.   MRN: [id number]  HPI Mario Ford is a pleasant 36 year old white male known to Mario Ford. He has history of GERD and recurrent diverticulitis. He underwent colonoscopy in August 2016 with finding of severe diverticulosis throughout the entire colon most prominent in the left colon. Because of numerous recurrences of diverticulitis he was referred to surgery and underwent a subtotal colectomy in November 2017 per Mario Ford with anastomosis terminal ileum 2 rectosigmoid colon.  Patient has had difficulty with diarrhea ever since his surgery. He says on good days he will have 4-6 bowel movements per day and is able to sleep through the night. This is with the use of Lomotil twice daily. Typically if he bleeds eats late he will have to get up at least once in the middle of the night. He has had episodes of bad days where he will have multiple bowel movements per day. This had been occurring about once per month. Now since May 12 he has developed lower abdominal pain, tenderness and had a prolonged episode of severe watery diarrhea up to 15 bowel movements per day. This was associated with fever to 102 at home on 08/24/2016. He seemed to get a little bit better and then had worsening of diarrhea over this past weekend again up to 15-17 times per day with lower abdominal discomfort and soreness. His appetite has been decreased and he feels very fatigued. Temp max of 100.8 this weekend.  He did receive a course of Augmentin in February 2018 per Mario Ford when he was treated for probable bacterial overgrowth. He does not think he's had any antibiotics since then.  Review of Systems Pertinent positive and negative review of systems were noted in the above HPI section.  All other review of systems was otherwise negative.  Outpatient Encounter Prescriptions as of 09/08/2016  Medication Sig  . acetaminophen (TYLENOL) 500 MG tablet Take 500-1,000  mg by mouth every 6 (six) hours as needed (for pain).  Mario Ford Kitchen. aspirin-acetaminophen-caffeine (EXCEDRIN MIGRAINE) 250-250-65 MG tablet Take 1-2 tablets by mouth every 6 (six) hours as needed for headache (for pain.).  Mario Ford Kitchen. diphenoxylate-atropine (LOMOTIL) 2.5-0.025 MG tablet Take up to 8 tablets daily as needed for diarrhea.  . escitalopram (LEXAPRO) 10 MG tablet   . ibuprofen (ADVIL,MOTRIN) 200 MG tablet Take 600-800 mg by mouth every 6 (six) hours as needed (for pain.).  Mario Ford Kitchen. omeprazole (PRILOSEC) 40 MG capsule Take 1 capsule (40 mg total) by mouth daily.  . ondansetron (ZOFRAN) 4 MG tablet Take 1 tablet (4 mg total) by mouth every 8 (eight) hours as needed for nausea or vomiting.  . [DISCONTINUED] diphenoxylate-atropine (LOMOTIL) 2.5-0.025 MG tablet Take 1 tablet by mouth 2 (two) times daily.  . [DISCONTINUED] amoxicillin-clavulanate (AUGMENTIN) 875-125 MG tablet Take 1 tablet by mouth 2 (two) times daily.   Facility-Administered Encounter Medications as of 09/08/2016  Medication  . 0.9 %  sodium chloride infusion   Allergies  Allergen Reactions  . Sulfa Antibiotics Hives and Other (See Comments)    Childhood reaction   Patient Active Problem List   Diagnosis Date Noted  . Diverticulitis large intestine 02/10/2016  . Diverticulitis of colon 10/21/2014  . Internal hemorrhoids 08/03/2014  . GERD (gastroesophageal reflux disease) 07/04/2014   Social History   Social History  . Marital status: Married    Spouse name: N/A  . Number of children: N/A  . Years of education: N/A  Occupational History  . Accountant     Social History Main Topics  . Smoking status: Never Smoker  . Smokeless tobacco: Never Used  . Alcohol use 0.0 oz/week     Comment: rare  . Drug use: No  . Sexual activity: Not on file   Other Topics Concern  . Not on file   Social History Narrative  . No narrative on file    [name] family history includes Diabetes Mellitus II in his maternal grandfather and  paternal grandfather; Melanoma in his father.      Objective:    Vitals:   09/08/16 1435  BP: 110/80  Pulse: 96    Physical Exam  well-developed white male in no acute distress, blood pressure 110/80 pulse 96, height 5 foot 5, weight 193, BMI of 31.6. HEENT ;nontraumatic normocephalic EOMI PERRLA sclera anicteric, Cardiovascular ;regular rate and rhythm with S1-S2 no murmur rub or gallop, Pulmonary ;clear bilaterally, Abdomen ;soft, bowel sounds are active to hyperactive he has some tenderness bilaterally in the lower quadrants there is no guarding or rebound no mass or hepatosplenomegaly, Rectal; exam not done, Extremities; no clubbing cyanosis or edema skin warm and dry, Neuropsych; mood and affect appropriate       Assessment & Plan:   #39 36 year old white male status post subtotal colectomy November 2017 for severe diffuse diverticular disease and recurrent diverticulitis. Patient has had 4-6 bowel movements per day since surgery and has been using Lomotil regularly.  He was treated with a course of Augmentin in February 2018 for possible bacterial overgrowth with questionable response. He comes in today with 2-1/2 week history of severe diarrhea, lower abdominal discomfort/soreness, intermittent fevers and fatigue.  I suspect he has a superimposed infectious diarrhea, will need to rule out C. Difficile   Plan; CBC with differential, BMET, sedimentation rate and GI pathogen panel May increase Lomotil to up to 8 tablets per day acutely Colestid had been discussed in the past, he may benefit from this but will need to rule out infectious etiologies first Push liquids Further plans pending results of above studies.   Raegan Winders S Nephi Savage PA-C 09/08/2016   Cc: Renaldo Harrison, DO

## 2016-09-08 NOTE — Telephone Encounter (Signed)
Wife reports pt has been having abdominal pain and diarrhea off and on since May 10th. Reports he has had a fever of 101 during some of this time. Requests pt be seen. Pt scheduled to see Mike GipAmy Esterwood PA today at 2:30pm. Pts wife aware of appt.

## 2016-09-08 NOTE — Patient Instructions (Signed)
Please go to the basement level to have your labs drawn and stool study.  Increase Lomotil to up to 8 per day as needed for diarrhea.  Continue daily probiotic.  Push fluids.

## 2016-09-09 ENCOUNTER — Other Ambulatory Visit: Payer: 59

## 2016-09-09 DIAGNOSIS — R197 Diarrhea, unspecified: Secondary | ICD-10-CM

## 2016-09-09 DIAGNOSIS — R109 Unspecified abdominal pain: Secondary | ICD-10-CM

## 2016-09-10 LAB — CLOSTRIDIUM DIFFICILE BY PCR: Toxigenic C. Difficile by PCR: NOT DETECTED

## 2016-09-11 ENCOUNTER — Telehealth: Payer: Self-pay

## 2016-09-11 NOTE — Telephone Encounter (Signed)
DPR on file. Spoke with Robin. The patient reports to her he is having 10 to 15 watery bowel movements a day. She describes him as "walking around like a zombie". Please advise.

## 2016-09-11 NOTE — Telephone Encounter (Signed)
-----   Message from Sammuel CooperAmy S Esterwood, PA-C sent at 09/10/2016 12:20 PM EDT ----- Please let pt know stool for cdiff is negative- please find out how he is doing with diarrhea-?

## 2016-09-14 ENCOUNTER — Other Ambulatory Visit: Payer: Self-pay

## 2016-09-14 MED ORDER — COLESTIPOL HCL 1 G PO TABS: 4.0000 g | ORAL_TABLET | Freq: Two times a day (BID) | ORAL | 0 refills | Status: DC

## 2016-09-14 NOTE — Telephone Encounter (Signed)
Spoke with Mrs. Mario Ford. Patient is the same. She will instruct his on the plan of care. Appointment for 10/15/16 with Dr [name] (first opening).

## 2016-09-14 NOTE — Telephone Encounter (Signed)
He can continue up to 8 Lomotil per day short term . Lets start Colestid 4 gm po bid , and get him a follow up soon with Dr .[name]. Ask him to call back with progress a few days after starting Colestid

## 2016-10-15 ENCOUNTER — Ambulatory Visit (INDEPENDENT_AMBULATORY_CARE_PROVIDER_SITE_OTHER): Payer: 59 | Admitting: Internal Medicine

## 2016-10-15 ENCOUNTER — Encounter: Payer: Self-pay | Admitting: Internal Medicine

## 2016-10-15 ENCOUNTER — Ambulatory Visit: Payer: 59 | Admitting: Internal Medicine

## 2016-10-15 VITALS — BP 120/78 | HR 82 | Ht 67.0 in | Wt 201.0 lb

## 2016-10-15 DIAGNOSIS — K219 Gastro-esophageal reflux disease without esophagitis: Secondary | ICD-10-CM

## 2016-10-15 DIAGNOSIS — R197 Diarrhea, unspecified: Secondary | ICD-10-CM

## 2016-10-15 MED ORDER — DIPHENOXYLATE-ATROPINE 2.5-0.025 MG PO TABS: ORAL_TABLET | ORAL | 6 refills | Status: AC

## 2016-10-15 MED ORDER — COLESTIPOL HCL 1 G PO TABS: 4.0000 g | ORAL_TABLET | Freq: Two times a day (BID) | ORAL | 6 refills | Status: DC

## 2016-10-15 NOTE — Progress Notes (Signed)
HISTORY OF PRESENT ILLNESS:  Mario Ford is a 36 y.o. male with GERD and recurrent diverticulitis for which she underwent subtotal colectomy with Dr. Johna Sheriff in early November 2017. He was evaluated here in January for significant postoperative nausea, weight loss, and diarrhea. Problems with nausea for the most part have resolved. He rarely uses Zofran. He has been gaining weight. 15-20 pounds since January. For his diarrhea he was treated with Lomotil and empirically prescribed Augmentin for possible bacterial overgrowth. That did not seem to help. He was prescribed Colestid 2 g twice daily. He is actually taking 4 g twice daily. Using 4-6 Lomotil per day. He saw Amy [name] me for an acute diarrheal illness associated with fever. Clostridium difficile testing was negative and laboratories were unremarkable. The problem resolved in 3-4 days. She tells me that he feels like he has periodic episodes of fever with watery stools for 3-4 days. However, has had no problems since May. Currently describes 6 date bowel movements per day with some form. No blood. No abdominal pain. Occasional nocturnal diarrhea if he eats late. He has noticed that he was having problems with heartburn more recently. He has adjusted the timing of his Colestid which she feels has helped.  REVIEW OF SYSTEMS:  All non-GI ROS negative except for sinus allergy, anxiety  Past Medical History:  Diagnosis Date  . Anal itch   . Anxiety   . Diverticulitis   . Esophageal reflux   . Fatty liver   . Functional dyspepsia   . Health education   . History of hiatal hernia   . Obesity   . Vitamin D deficiency     Past Surgical History:  Procedure Laterality Date  . acl-left knee Left    x 2  . APPENDECTOMY    . LAPAROSCOPIC PARTIAL COLECTOMY N/A 02/10/2016   Procedure: LAPAROSCOPIC LEFT HEMI COLECTOMY CONVERTED TO OPEN SUBTOTAL COLECTOMY;  Surgeon: Glenna Fellows, MD;  Location: [facility];  Service: General;  Laterality: N/A;  .  TONSILLECTOMY      Social History Lonny Eisen  reports that he has never smoked. He has never used smokeless tobacco. He reports that he drinks alcohol. He reports that he does not use drugs.  family history includes COPD in his mother; Diabetes Mellitus II in his maternal grandfather and paternal grandfather; Heart disease in his mother; Melanoma in his father.  Allergies  Allergen Reactions  . Sulfa Antibiotics Hives and Other (See Comments)    Childhood reaction       PHYSICAL EXAMINATION: Vital signs: BP 120/78   Pulse 82   Ht 5\' 7"  (1.702 m)   Wt 201 lb (91.2 kg)   BMI 31.48 kg/m   Constitutional: generally well-appearing, no acute distress Psychiatric: alert and oriented x3, cooperative Eyes: extraocular movements intact, anicteric, conjunctiva pink Mouth: oral pharynx moist, no lesions Neck: supple no lymphadenopathy Cardiovascular: heart regular rate and rhythm, no murmur Lungs: clear to auscultation bilaterally Abdomen: soft, nontender, nondistended, no obvious ascites, no peritoneal signs, normal bowel sounds, no organomegaly. Midline incision well-healed Rectal: Omitted  Extremities: no clubbing cyanosis or lower extremity edema bilaterally Skin: no lesions on visible extremities Neuro: No focal deficits. Cranial nerves intact  ASSESSMENT:  #1. Diarrhea secondary to subtotal colectomy. Stable on current medical regimen #2. Status post subtotal colectomy for recurrent diverticular disease #3. Medical nausea. Improved #4. GERD. Has been having some breakthrough symptoms. May be due to suboptimal absorption of PPI in the face of Colestid. May also be  due to weight gain   PLAN:  #1. Continue Lomotil. Refilled #2. Continue Colestid. Discussed trying to decrease the dose to 2 g twice a day #3. Continue PPI. Proper timing reviewed #4. Reflux precautions #5. Routine GI follow-up 6 months   25 minutes was spent face-to-face with the patient. Greater than 50% a  time use for counseling regarding management of his diarrhea and GERD

## 2016-10-15 NOTE — Patient Instructions (Signed)
We have sent the following medications to your pharmacy for you to pick up at your convenience: Colestid and Lomotil  Please follow up in 6 months

## 2016-11-24 ENCOUNTER — Telehealth: Payer: Self-pay | Admitting: Internal Medicine

## 2016-11-25 ENCOUNTER — Other Ambulatory Visit: Payer: Self-pay

## 2016-11-25 MED ORDER — COLESTIPOL HCL 1 G PO TABS: 4.0000 g | ORAL_TABLET | Freq: Two times a day (BID) | ORAL | 2 refills | Status: DC

## 2016-11-25 MED ORDER — COLESTIPOL HCL 1 G PO TABS: 4.0000 g | ORAL_TABLET | Freq: Two times a day (BID) | ORAL | 2 refills | Status: AC

## 2016-11-25 NOTE — Telephone Encounter (Signed)
Colestipol sent in for 90 days

## 2016-12-08 ENCOUNTER — Other Ambulatory Visit: Payer: Self-pay | Admitting: Internal Medicine

## 2017-06-12 ENCOUNTER — Other Ambulatory Visit: Payer: Self-pay | Admitting: Internal Medicine

## 2017-06-25 ENCOUNTER — Telehealth: Payer: Self-pay | Admitting: Internal Medicine

## 2017-06-25 NOTE — Telephone Encounter (Signed)
This is not a specific GI issue (what sounds like oral thrush). He should see his PCP

## 2017-06-25 NOTE — Telephone Encounter (Signed)
Pts wife states the ENT diagnosed pt with terrible yeast infection in his throat yesterday. States he was told to contact GI to have blood work done to see if this has spread into his blood stream. Pt was prescribed Diflucan orally and a compounded mouthwash that was not ready yet for them to pickup. Wife states pt has had a very sore throat and continues to feel bad and have diarrhea also. Wife wants to know if he can come in today to have labs done to see if candida has spread to bloodstream. Please advise.

## 2017-06-25 NOTE — Telephone Encounter (Signed)
Spoke with pts wife and she is aware of Dr. Lamar SprinklesPerry's recommendation.

## 2017-06-25 NOTE — Telephone Encounter (Signed)
Pt has a "thrush infection" and concerned it may be in his blood stream

## 2017-09-14 ENCOUNTER — Other Ambulatory Visit: Payer: Self-pay | Admitting: Internal Medicine

## 2017-10-01 ENCOUNTER — Other Ambulatory Visit: Payer: Self-pay | Admitting: Internal Medicine

## 2017-10-06 IMAGING — DX DG ABDOMEN ACUTE W/ 1V CHEST
4 series · 4 of 4 positions shown · non-contrast
Comparison: None available

CLINICAL DATA: Six days post colonic resection. Laparoscopic
partial colectomy performed 02/10/2016

EXAM:
DG ABDOMEN ACUTE W/ 1V CHEST

[chest pa]
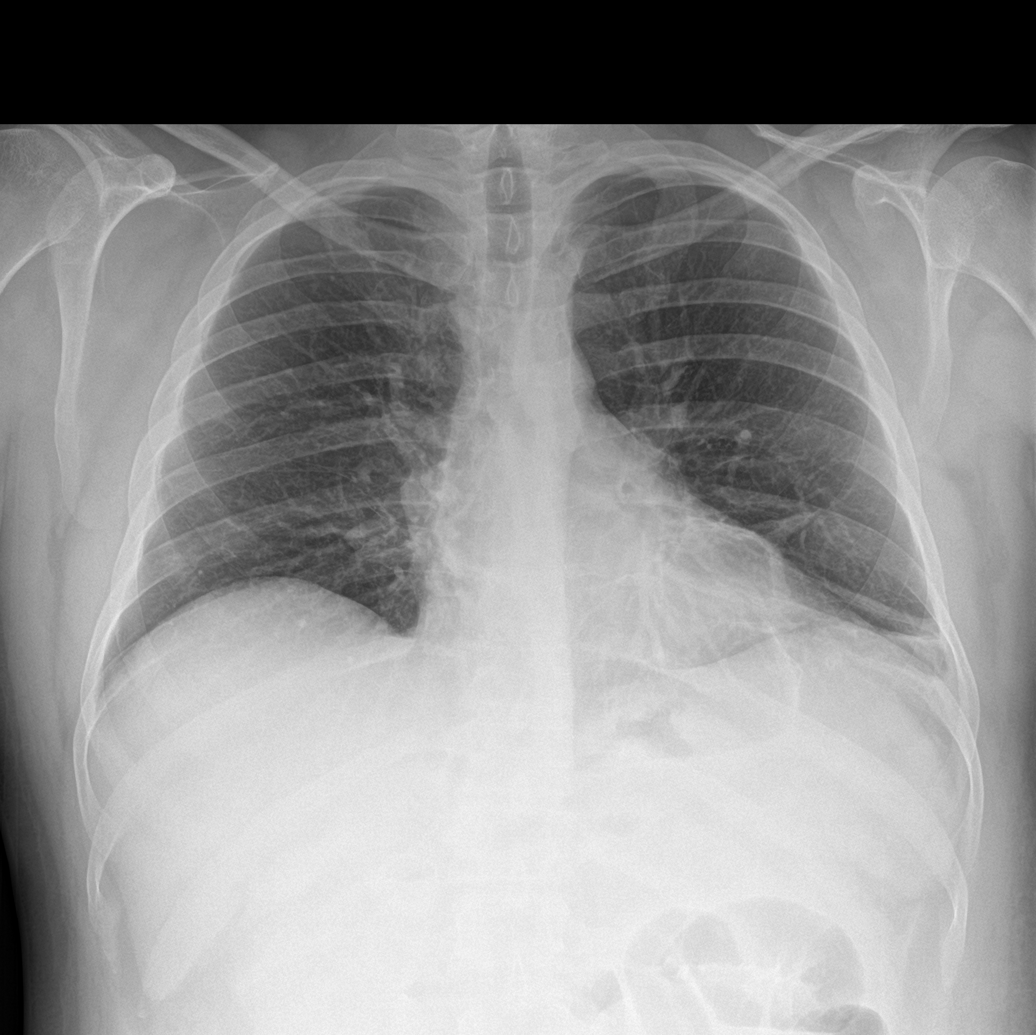

[abdomen erect]
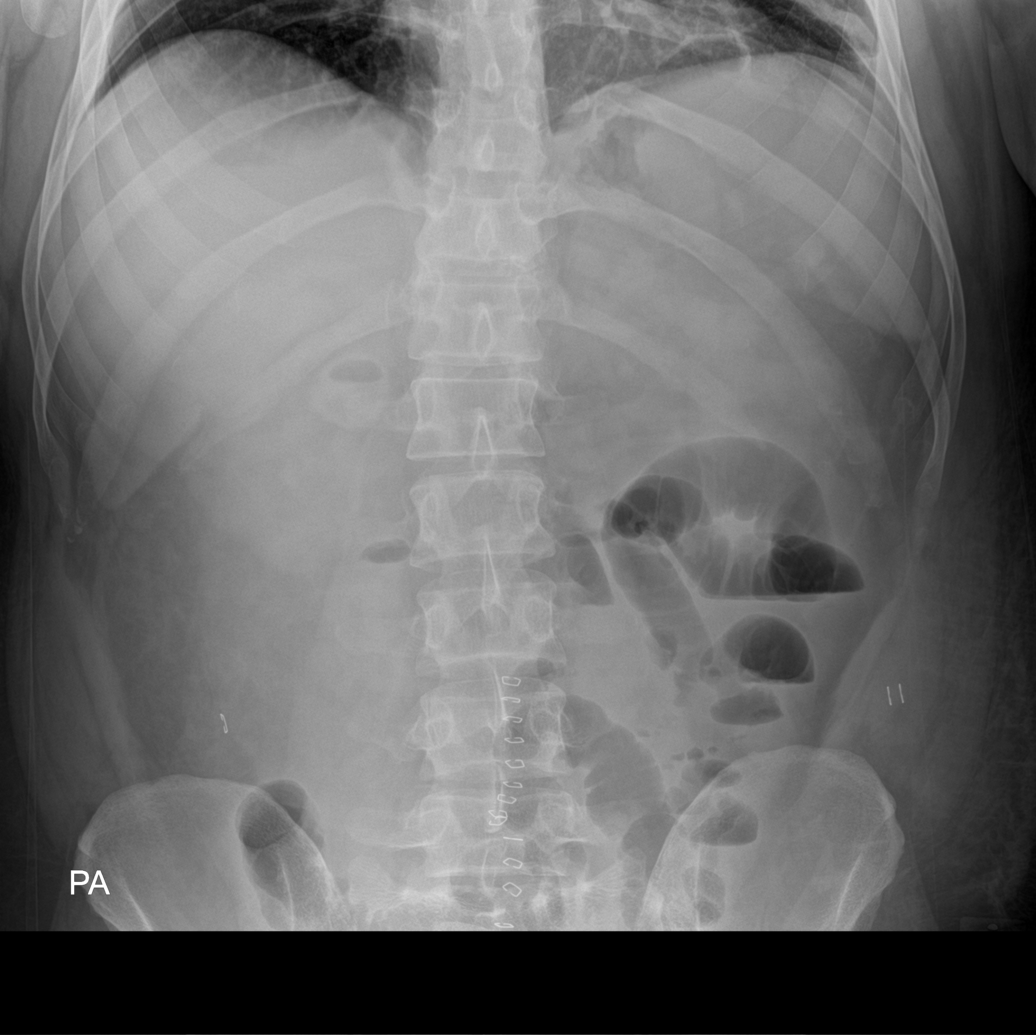

[abdomen supine (1 of 2)]
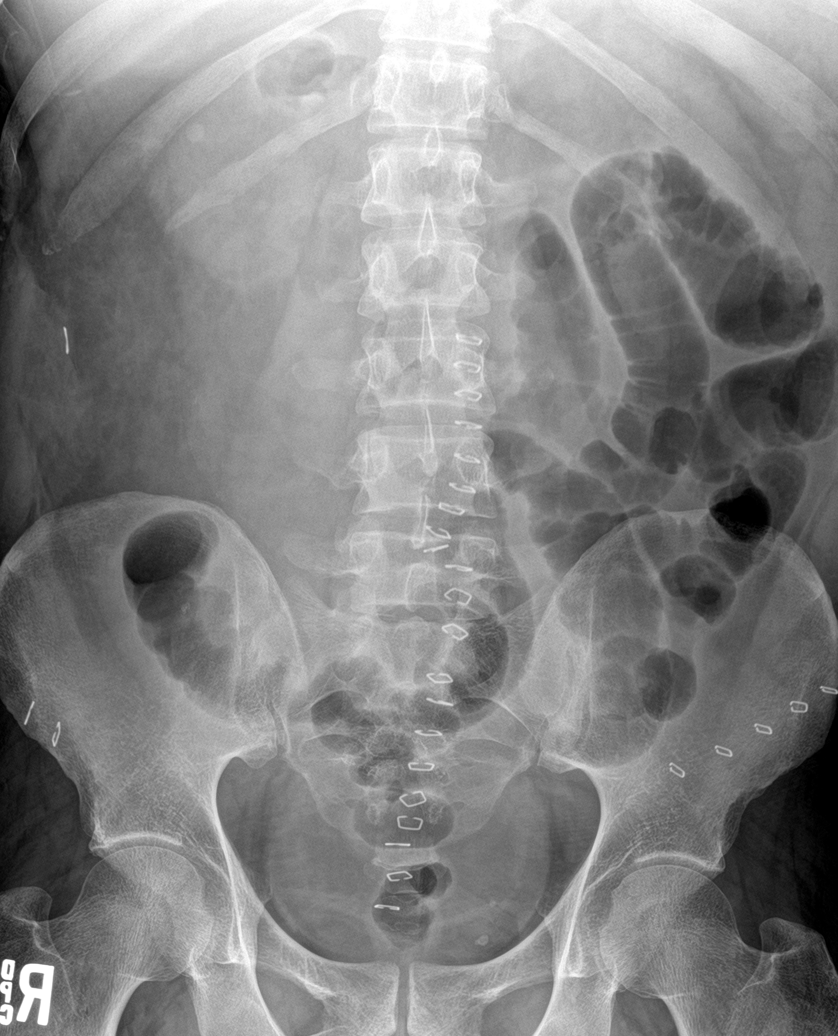

[abdomen supine (2 of 2)]
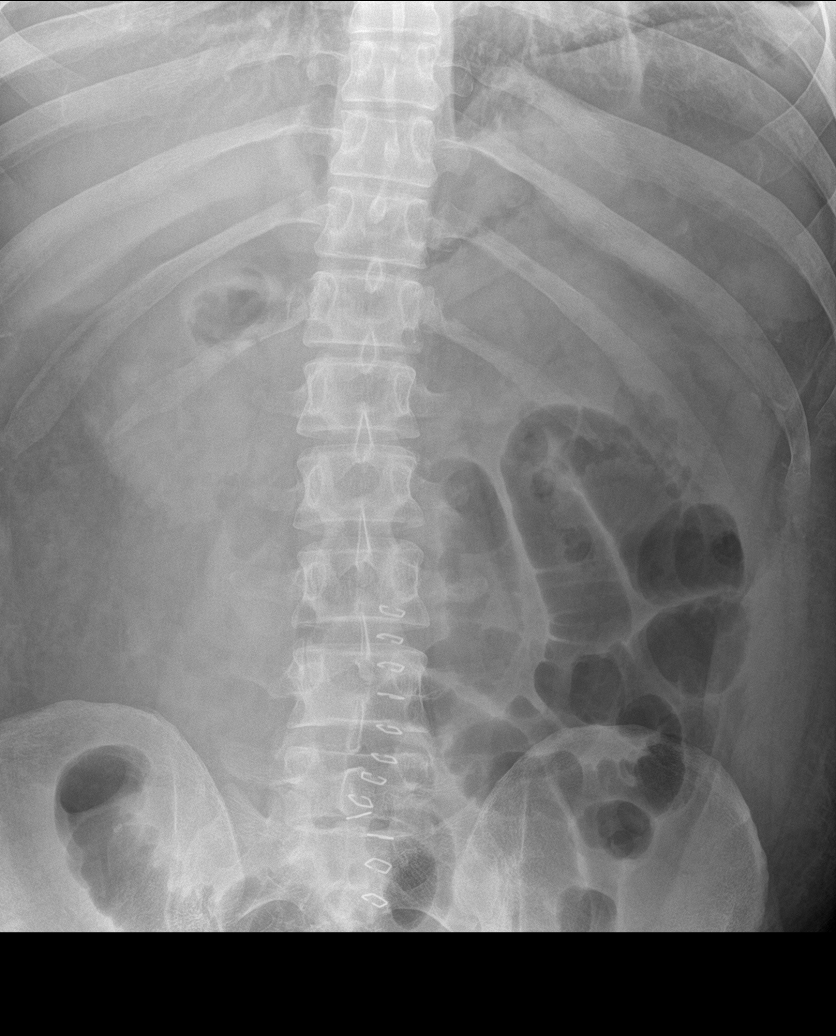

[4 of 4 positions shown; findings below may reference images not displayed]

FINDINGS: Low lung volumes with minor streaky left basilar atelectasis. Normal
heart size and vascularity. No focal pneumonia, collapse or
consolidation. No effusion or pneumothorax. Trachea is midline. No
free air.

Mild gas distention of small bowel in the left lower quadrant and
pelvis. Associated air-fluid levels on the upright exam. Relative
paucity of colonic gas however there is air present in the rectum.
Findings compatible with partial obstruction versus residual ileus.
No acute osseous finding.
IMPRESSION: Mild small-bowel distention in the left abdomen and pelvis with
associated air-fluid levels. Air noted in the rectum. Appearance
compatible with residual partial obstruction versus ileus.

No free air.

Left basilar atelectasis

## 2017-11-11 IMAGING — CR DG ABDOMEN 2V
2 series · 2 of 2 positions shown · non-contrast
Comparison: 02/16/2016

CLINICAL DATA: Nausea and diarrhea for several weeks. Six weeks
postop from subtotal colectomy for diverticulitis.

EXAM:
ABDOMEN - 2 VIEW

[w abdomen upright *]
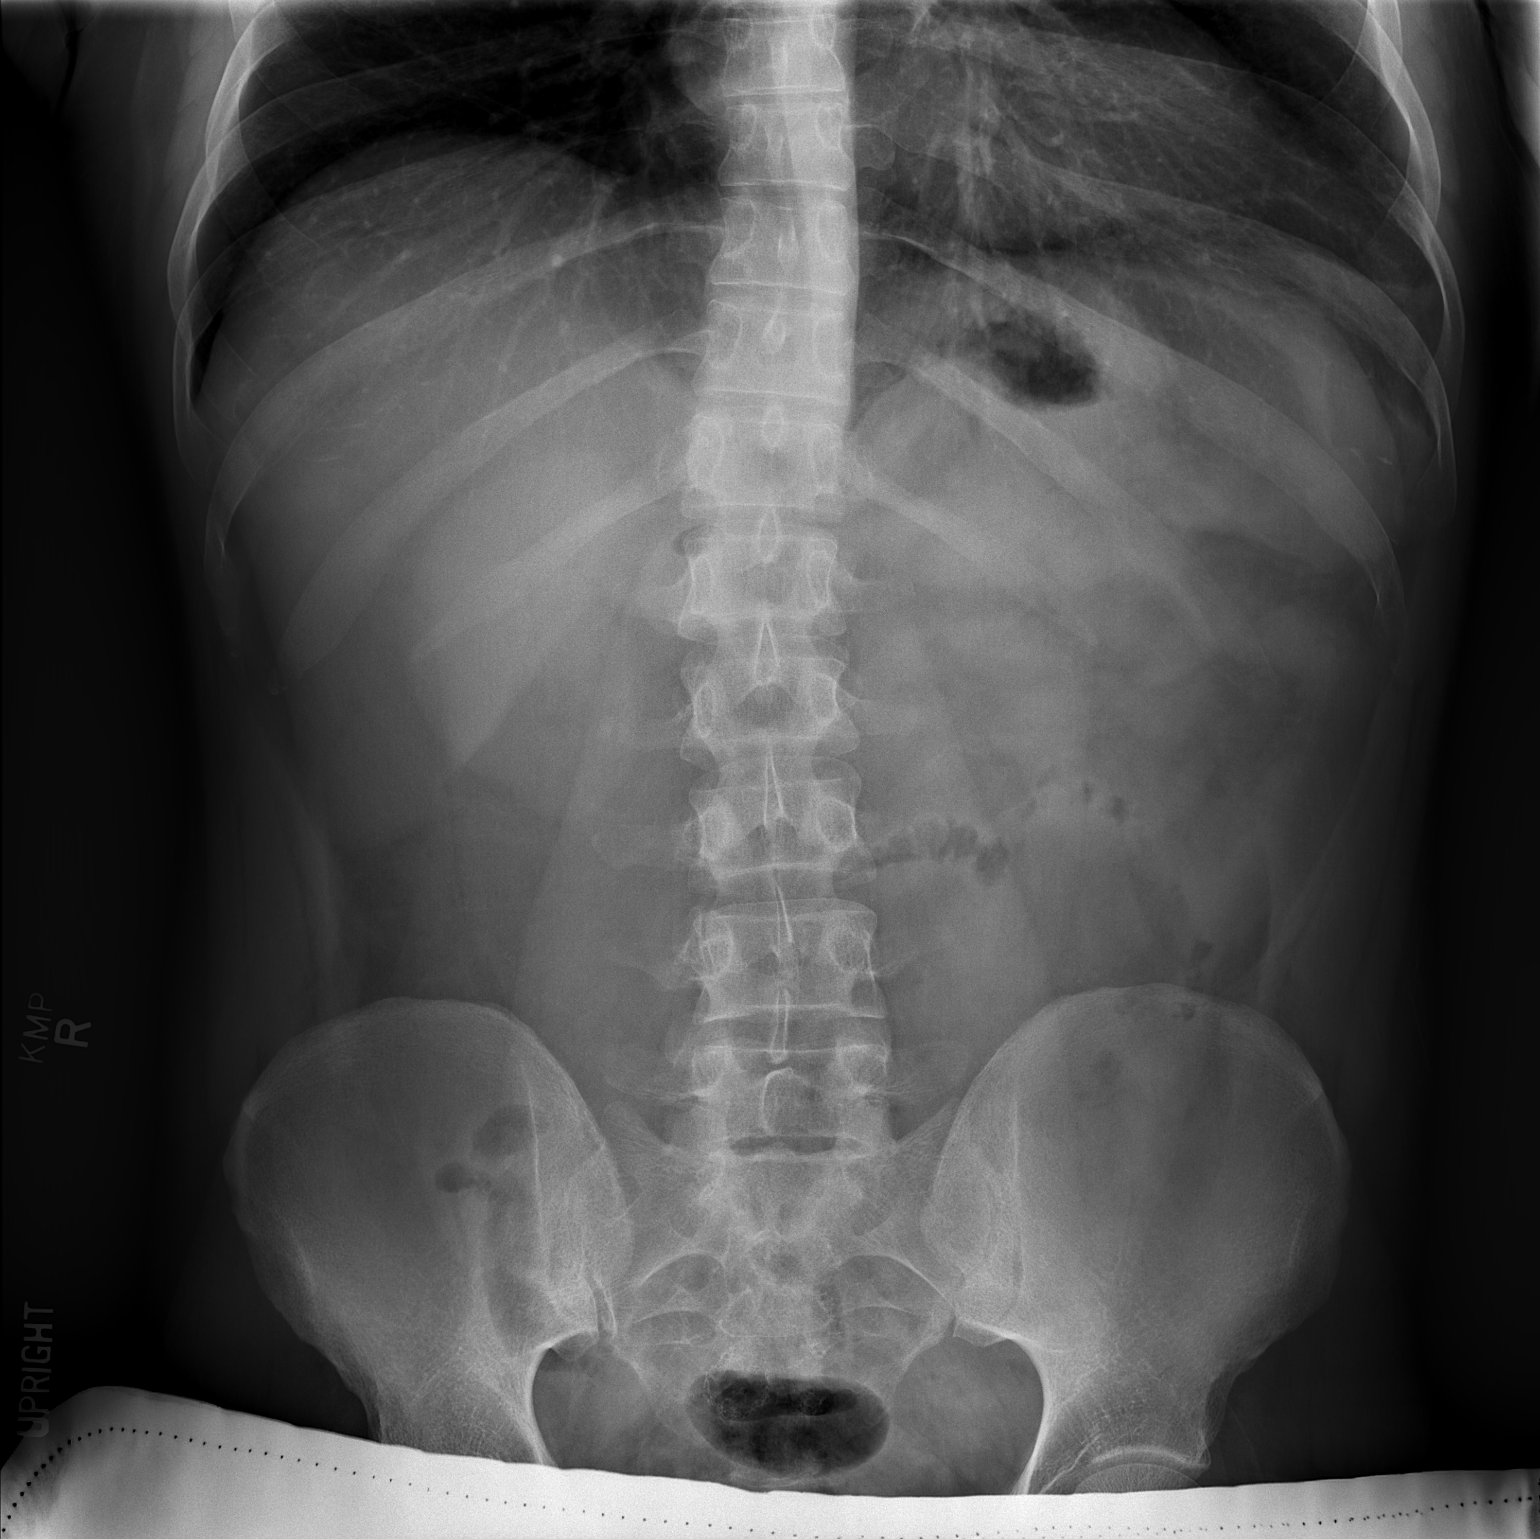

[t abdomen supine]
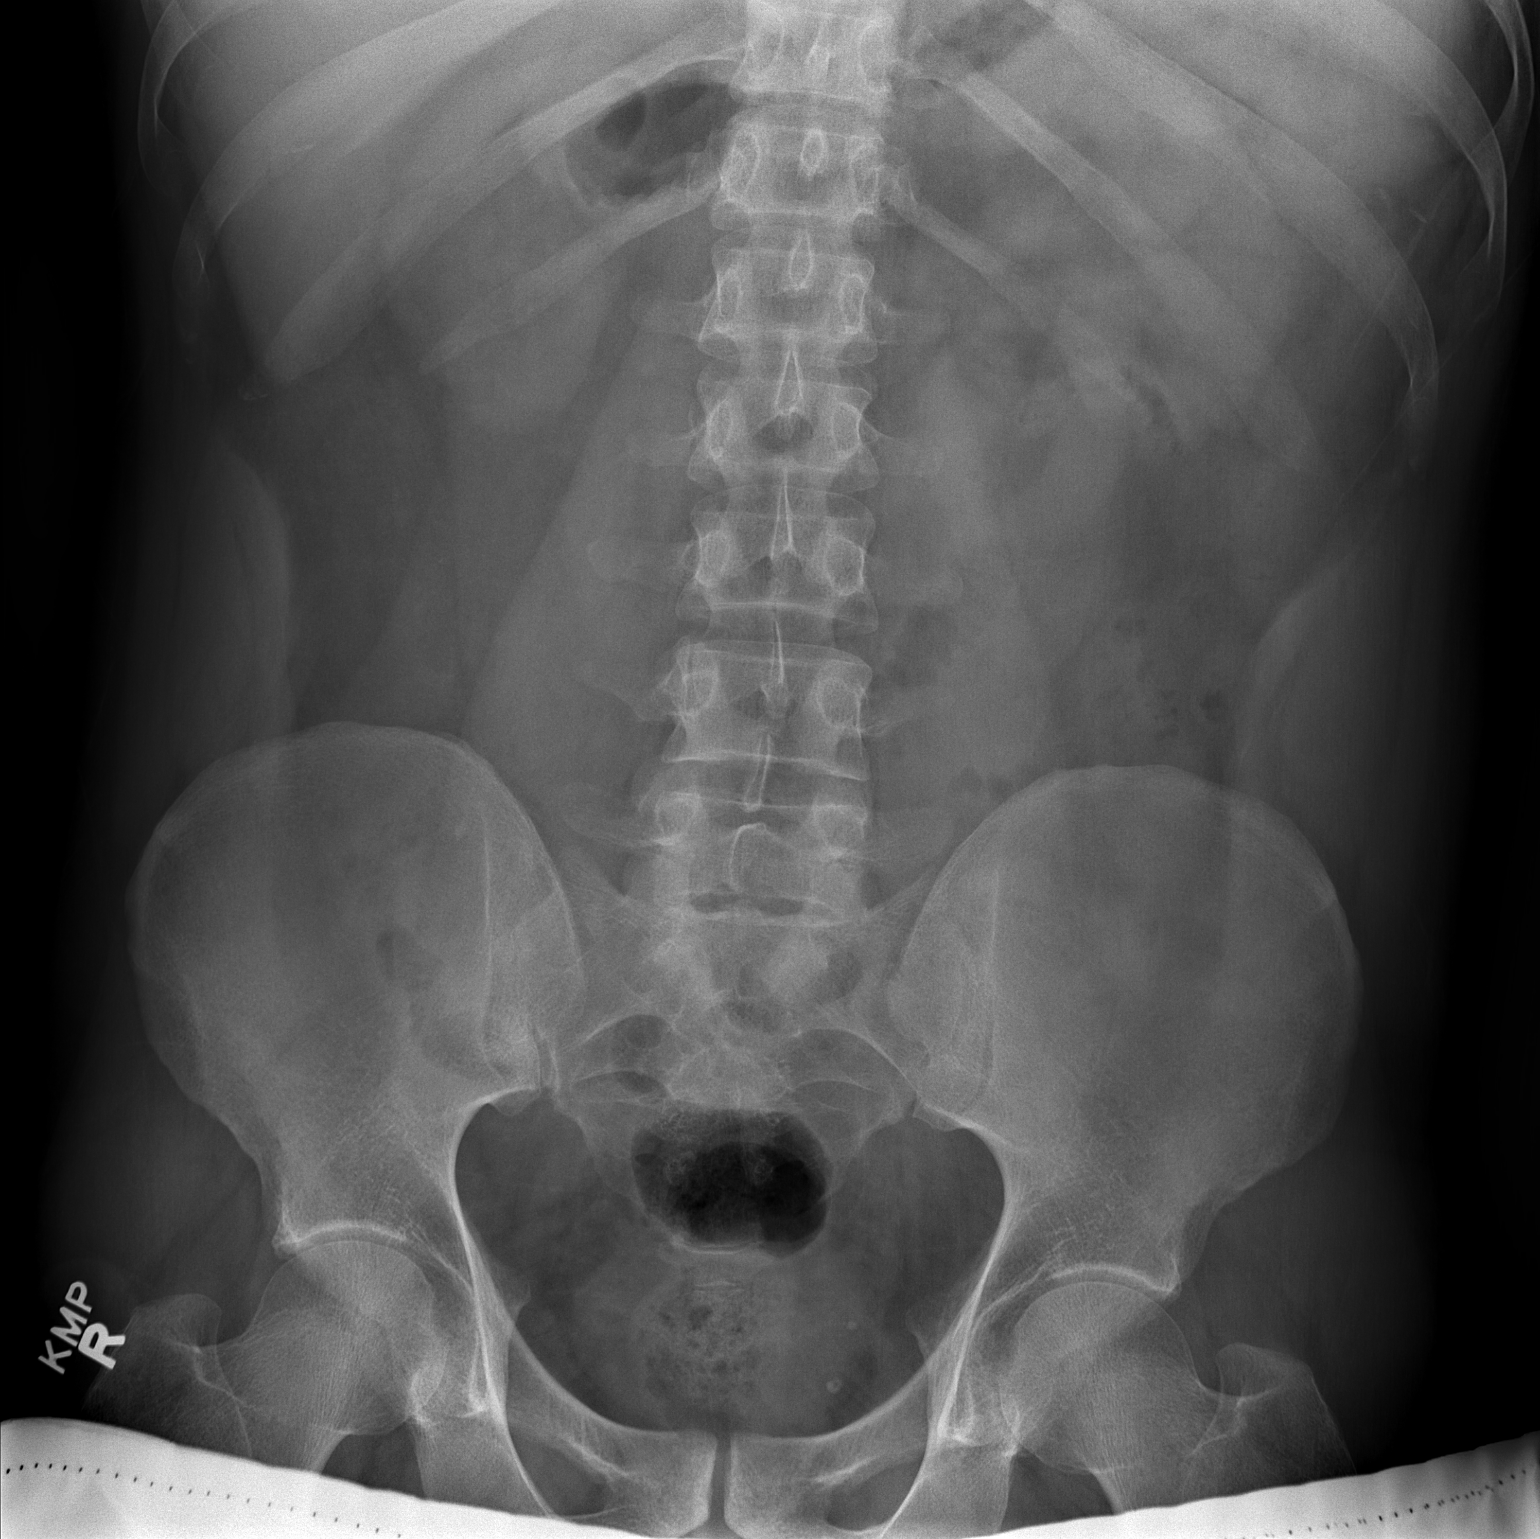

[2 of 2 positions shown; findings below may reference images not displayed]

FINDINGS: The bowel gas pattern is normal. There is no evidence of free air.
No radio-opaque calculi or other significant radiographic
abnormality is seen. Several left pelvic phleboliths again noted.
IMPRESSION: Normal bowel gas pattern.  No acute findings.
